# Patient Record
Sex: Male | Born: 1970 | Race: White | Hispanic: No | Marital: Married | Smoking: Former smoker
Health system: Southern US, Community
[De-identification: ages and names within clinical notes are randomized; demographics above are authoritative.]

---

## 2007-02-07 ENCOUNTER — Ambulatory Visit: Payer: Self-pay | Admitting: Internal Medicine

## 2007-02-07 ENCOUNTER — Emergency Department: Payer: Self-pay | Admitting: Emergency Medicine

## 2009-04-06 IMAGING — US ABDOMEN ULTRASOUND
1 series · 17 of 25 positions shown · non-contrast
Comparison: none

REASON FOR EXAM: RUQ, epigastric pain
COMMENTS:

[Series 1: abdomen ultrasound · 17 of 66 slices shown]
[im 1/66]
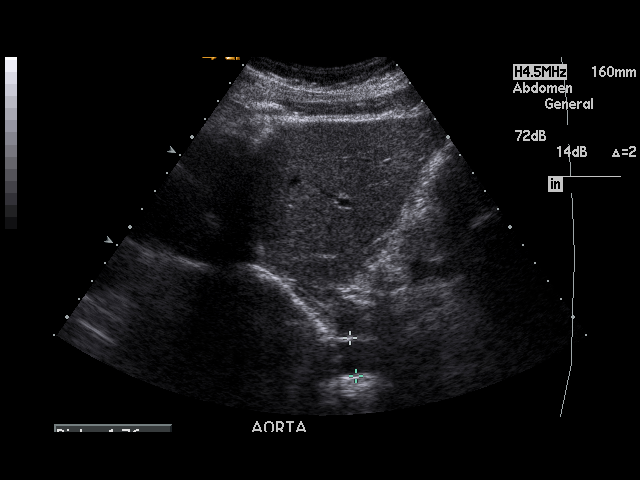
[im 6/66]
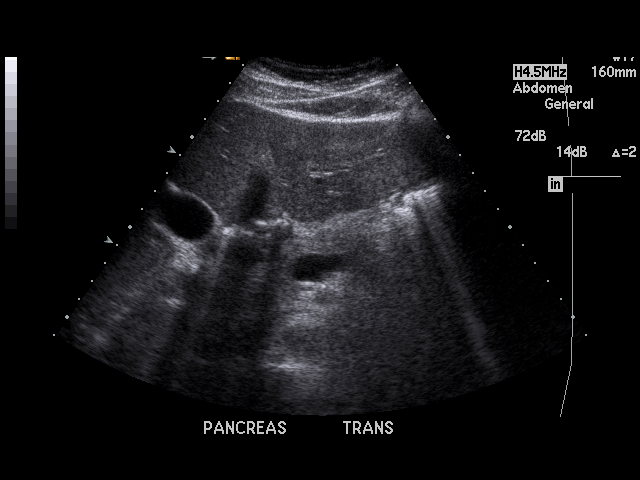
[im 9/66]
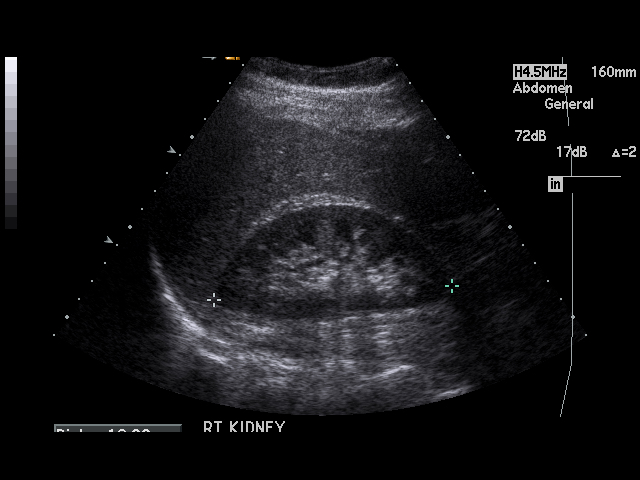
[im 14/66]
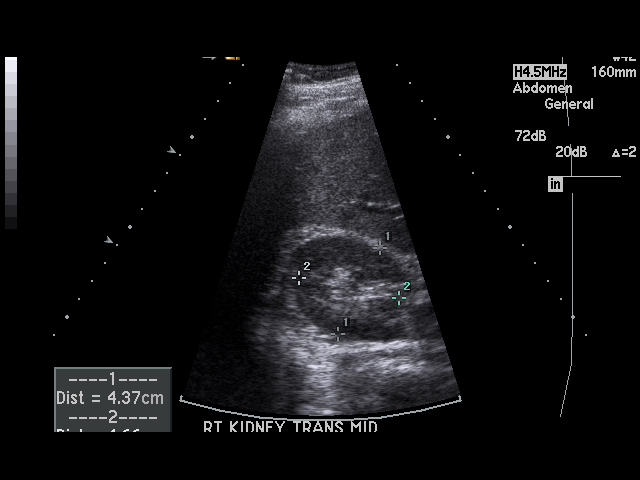
[im 17/66]
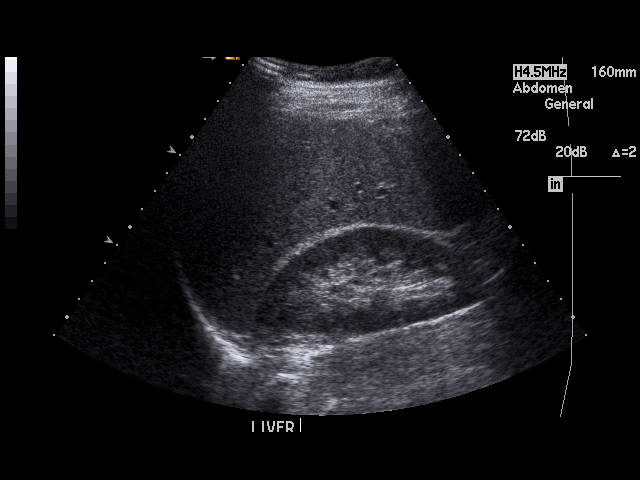
[im 22/66]
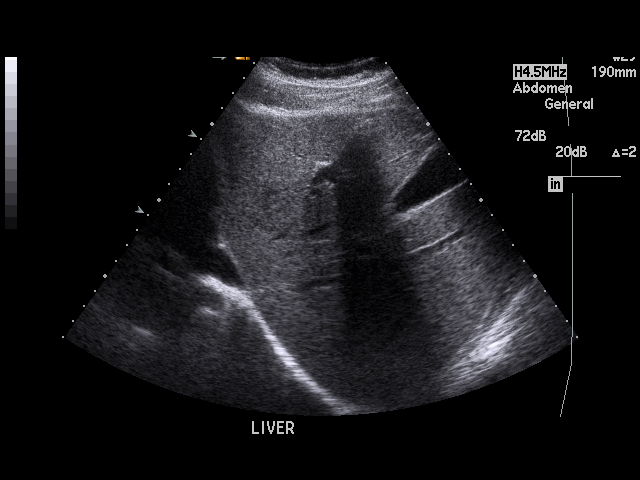
[im 25/66]
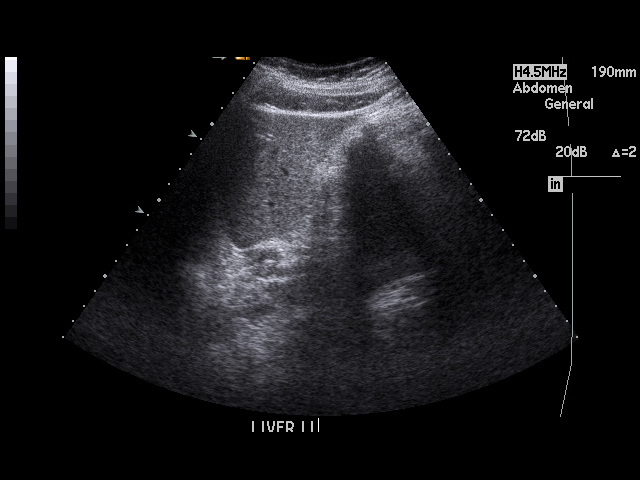
[im 30/66]
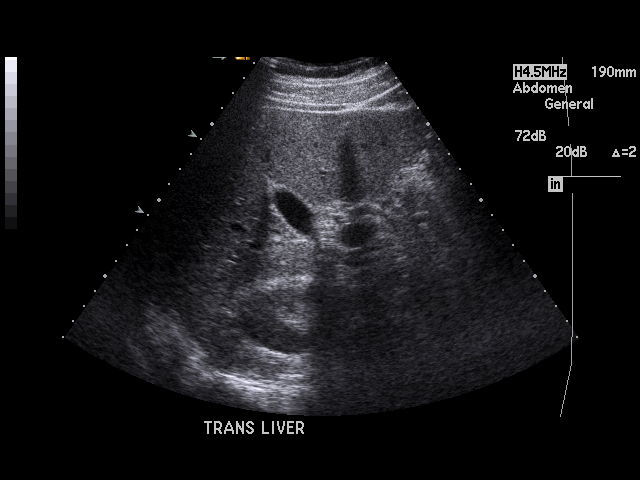
[im 33/66]
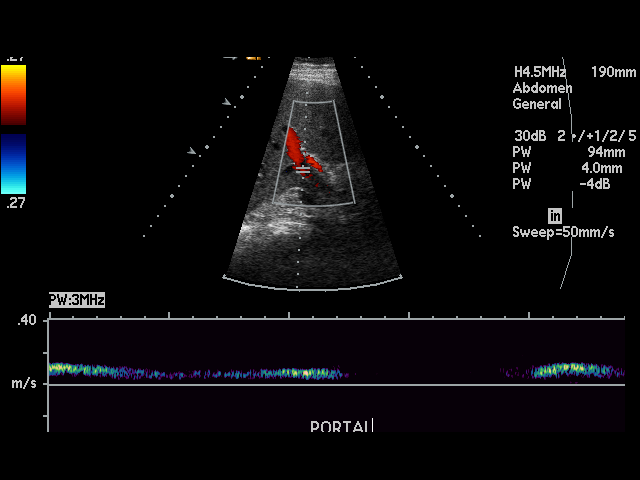
[im 36/66]
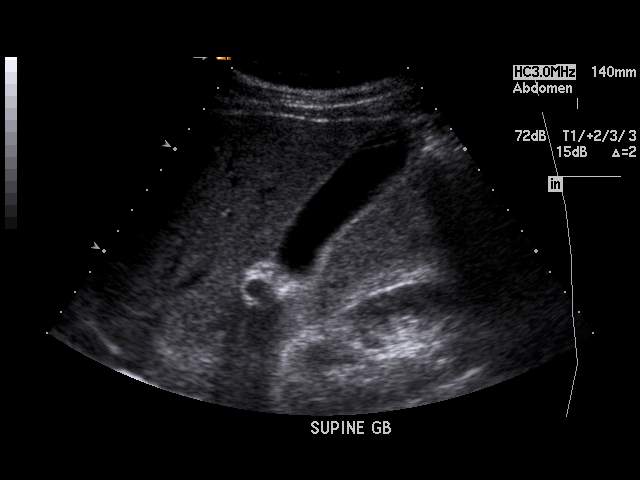
[im 41/66]
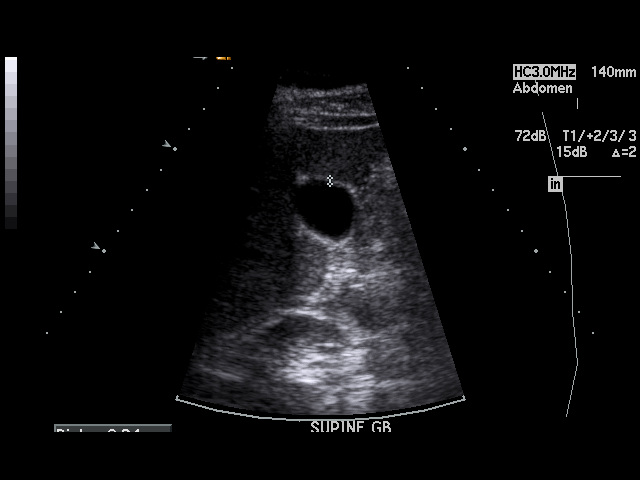
[im 44/66]
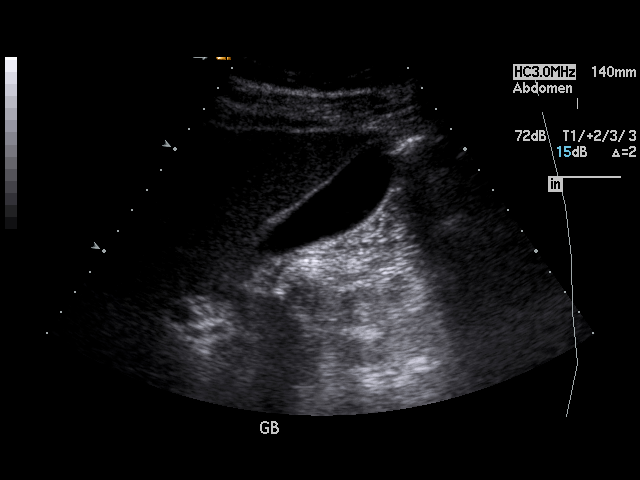
[im 49/66]
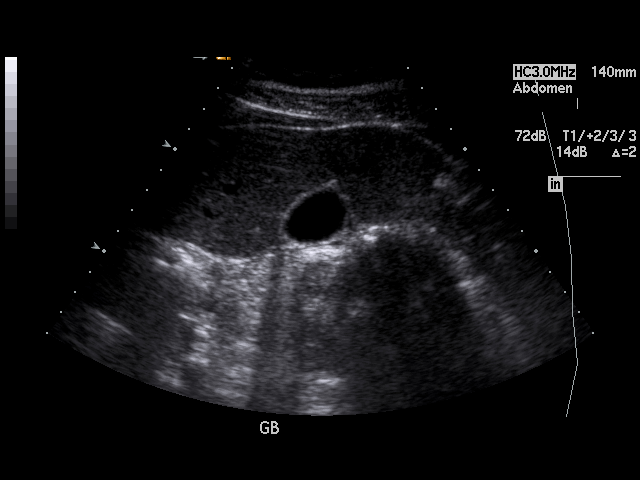
[im 52/66]
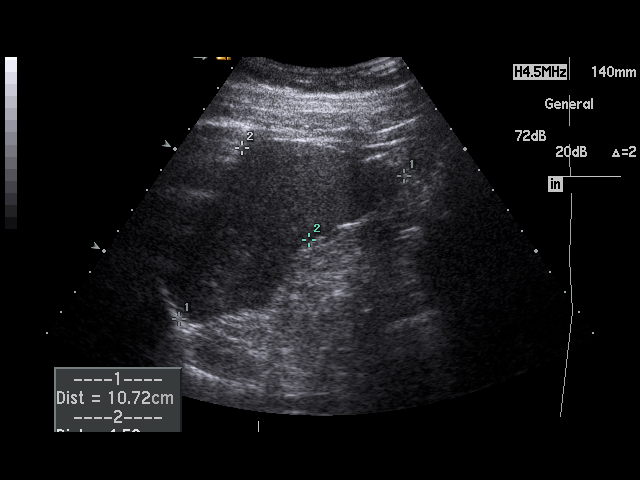
[im 57/66]
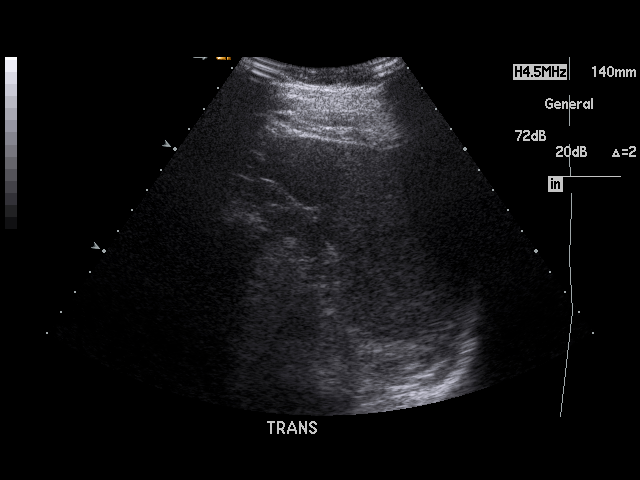
[im 60/66]
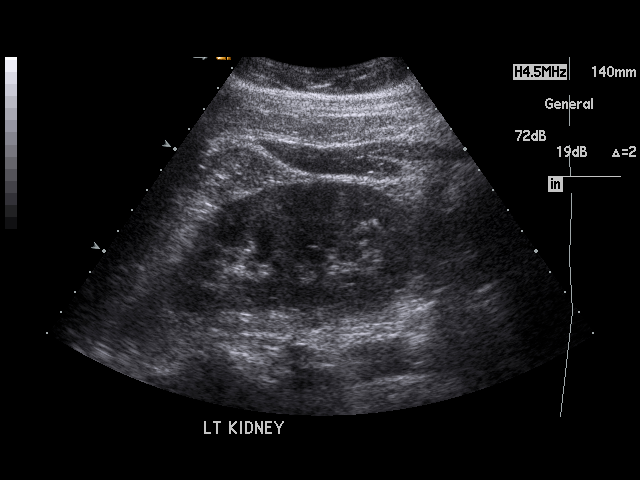
[im 66/66]
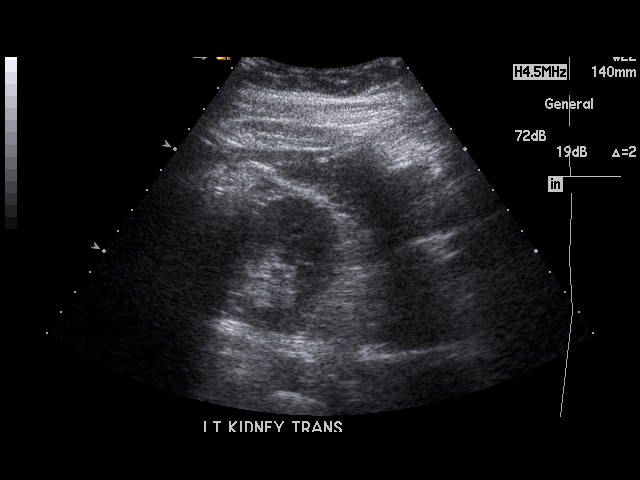

[17 of 25 positions shown; findings below may reference images not displayed]

PROCEDURE:     US  - US ABDOMEN GENERAL SURVEY  - February 07, 2007  [DATE]

RESULT:     Sonographic evaluation of the abdomen is performed emergently.
The gallbladder is normal. The common bile duct diameter is 4.3 mm. The
pancreas is not visualized because of overlying bowel gas. The kidneys,
spleen, liver and portal venous flow appear to be normal. The aorta was
partially obscured but showed no definite aneurysm or other significant
abnormality.
IMPRESSION: 1. Nonvisualization of the pancreas. Partial visualization of the aorta.
2. Otherwise, unremarkable abdominal sonogram.

## 2013-07-18 ENCOUNTER — Ambulatory Visit: Payer: Self-pay

## 2013-07-18 LAB — RAPID INFLUENZA A&B ANTIGENS

## 2015-02-27 ENCOUNTER — Ambulatory Visit
Admission: EM | Admit: 2015-02-27 | Discharge: 2015-02-27 | Discharge status: Absent without leave | Payer: BLUE CROSS/BLUE SHIELD

## 2015-09-15 IMAGING — CR DG CHEST 2V
1 series · 4 of 4 positions shown · non-contrast
Comparison: None.

CLINICAL DATA: Weakness, low-grade fever, dry cough for 3 months

EXAM:
CHEST  2 VIEW

[Series 1: pa · 0.17mm/px · 4 of 4 slices shown]
[im 1/4]
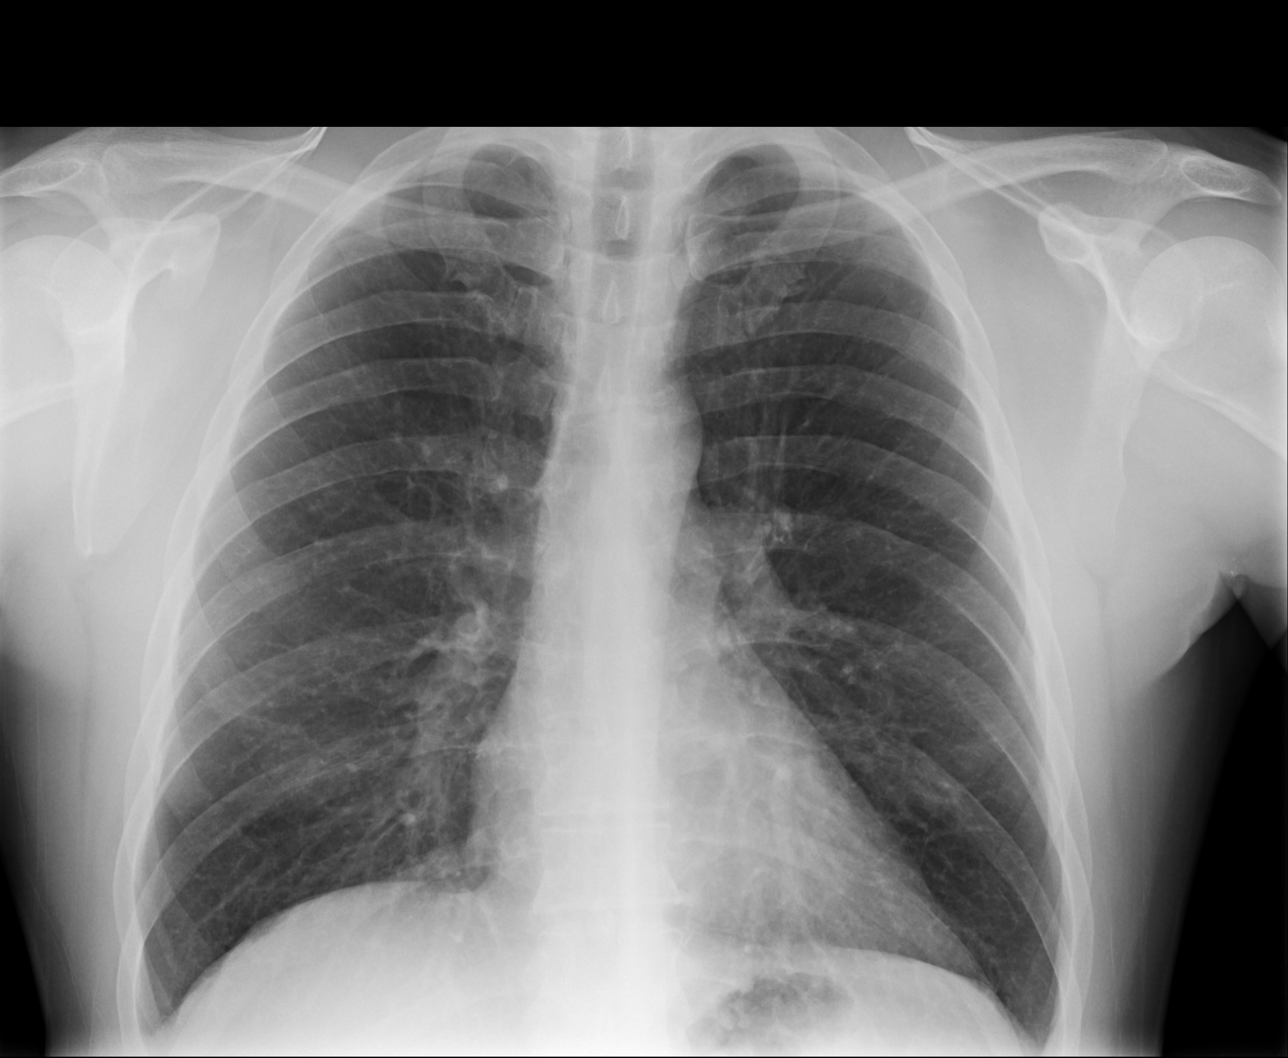
[im 2/4]
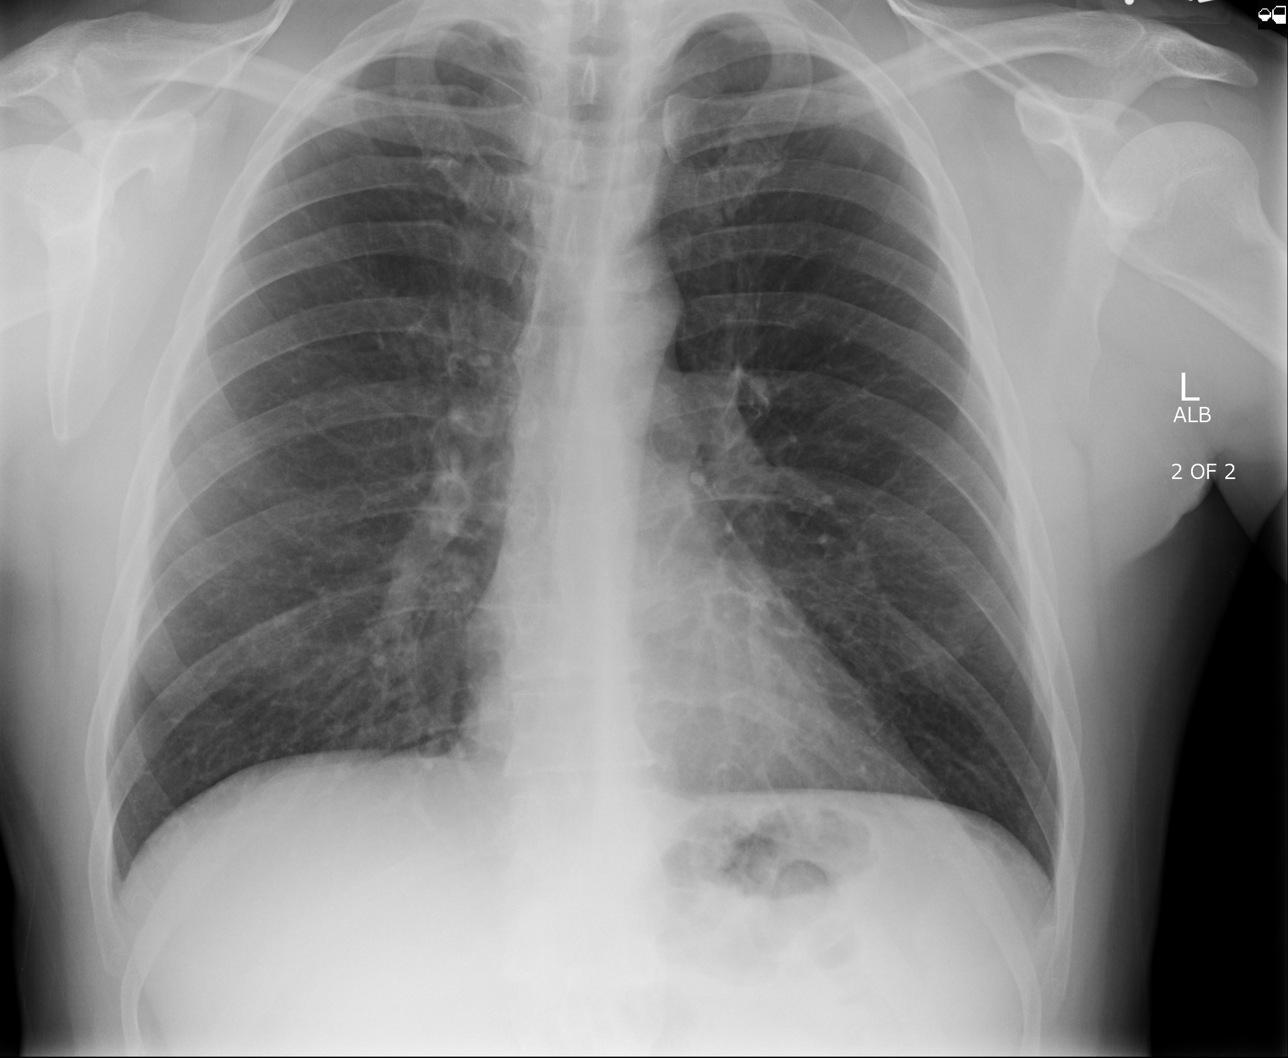
[im 3/4]
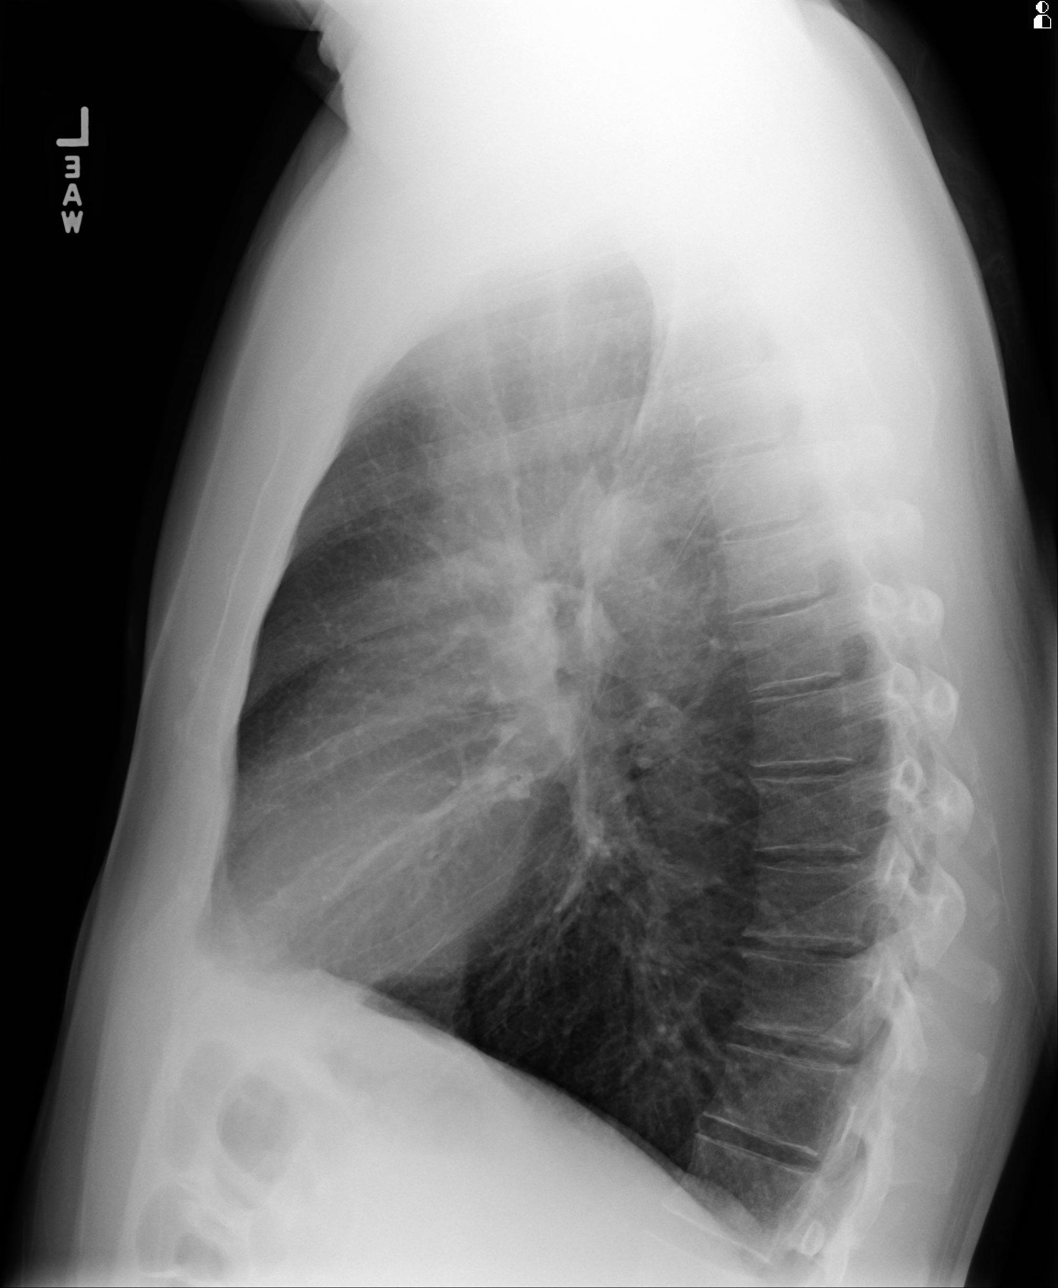
[im 4/4]
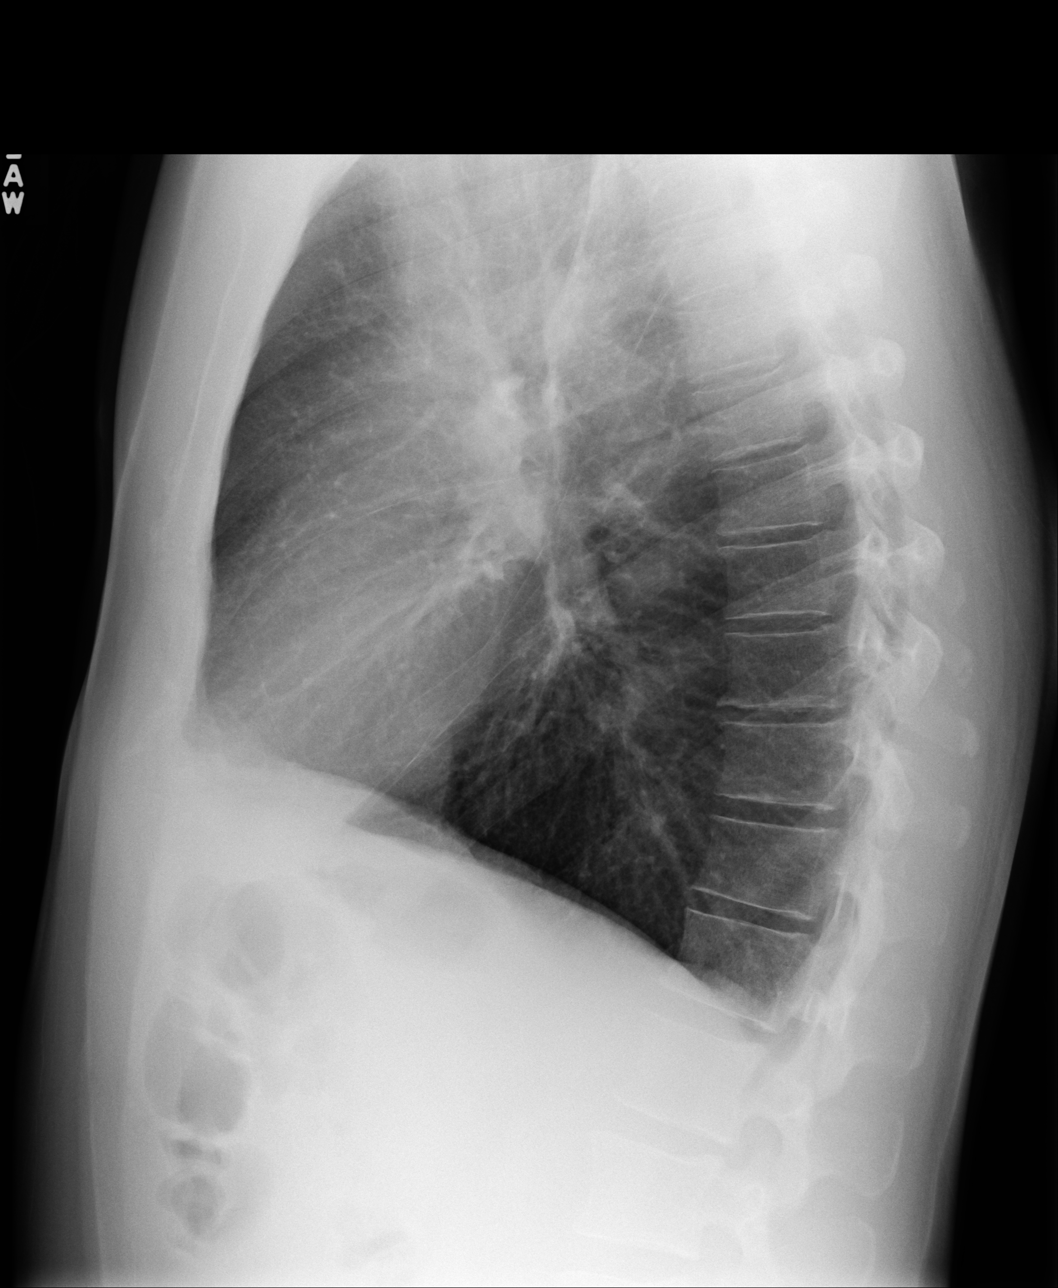

[4 of 4 positions shown; findings below may reference images not displayed]

FINDINGS: Cardiomediastinal silhouette is unremarkable. No acute infiltrate or
pleural effusion. No pulmonary edema. Minimal degenerative changes
thoracic spine. Mild hyperinflation.
IMPRESSION: No active disease.  Mild hyperinflation.

## 2018-07-31 ENCOUNTER — Ambulatory Visit (INDEPENDENT_AMBULATORY_CARE_PROVIDER_SITE_OTHER): Payer: Self-pay | Admitting: Family Medicine

## 2018-07-31 ENCOUNTER — Encounter: Payer: Self-pay | Admitting: Family Medicine

## 2018-07-31 ENCOUNTER — Other Ambulatory Visit: Payer: Self-pay

## 2018-07-31 DIAGNOSIS — K219 Gastro-esophageal reflux disease without esophagitis: Secondary | ICD-10-CM

## 2018-07-31 DIAGNOSIS — K279 Peptic ulcer, site unspecified, unspecified as acute or chronic, without hemorrhage or perforation: Secondary | ICD-10-CM

## 2018-07-31 DIAGNOSIS — Z7689 Persons encountering health services in other specified circumstances: Secondary | ICD-10-CM

## 2018-07-31 DIAGNOSIS — R1013 Epigastric pain: Secondary | ICD-10-CM

## 2018-07-31 MED ORDER — OMEPRAZOLE 40 MG PO CPDR: 40.0000 mg | DELAYED_RELEASE_CAPSULE | Freq: Every day | ORAL | 2 refills | Status: DC

## 2018-07-31 MED ORDER — SUCRALFATE 1 G PO TABS: 1.0000 g | ORAL_TABLET | Freq: Three times a day (TID) | ORAL | 1 refills | Status: DC

## 2018-07-31 NOTE — Patient Instructions (Addendum)
Thank you for coming to the office today.  I am concerned that one significant possibility could be uncontrolled Acid Reflux (GERD) and may have developed an Ulcer (Peptic Ulcer of stomach).  After you have completed your H Pylori Breath Test today, we can start with the prescribed medicines, and we will notify you of your result and if we have to change treatment.  Today you willcomplete the H Pylori Breath Test, FASTING for 4 hours before the test, NO FOOD OR DRINK, only small amount of water is fine, can take other medicines).  IF BREATH TEST = NEGATIVE (or waiting for results): Starting TODAY before LUNCH take Omeprazole 40mg  daily. Prefer to take this med about 30 min before breakfast or 1st meal of day for 4 weeks, don't stop taking unless we discuss first. Probably will need for about 8 weeks total if this ends up being an Ulcer. - Next day can switch over to [facility] - take 15-30 min before BREAKFAST every day going forward for the 8 weeks  Take other prescribed medicine Carafate (Sucralfate) as needed up to 4 times daily (3 meals and bedtime)  to coat stomach lining to ease symptoms, if it helps reduce symptoms then it is more likely to be due to acid and/or ulcer.  IF BREATH TEST = POSITIVE (we notified you of this result): - CHANGE Protonix dose from 40mg  daily to 40mg  (one pill) TWICE daily, about 30 min before breakfast and dinner - FOR TWO WEEKS, then resume DAILY treatment - Also we will send in TWO antibiotics to take IN ADDITION: - Amoxicillin 1g TWICE daily AND Clarithromycin 500mg  TWICE daily - both for 2 weeks  DIET RECOMMENDATIONS - Avoid spicy, greasy, fried foods, also things like caffeine, dark chocolate, peppermint can worsen - Avoid large meals and late night snacks, also do not go more than 4-5 hours without a snack or meal (not eating will worsen reflux symptoms due to stomach acid) - You may also elevate the head of your bed at night to sleep at very slight  incline to help reduce symptoms  If the problem improves but keeps coming back, we can discuss higher dose or longer course at next visit.  If symptoms are worsening or persistent despite treatment or develop any different severe esophagus or abdominal pain, unable to swallow solids or liquids, nausea, vomiting especially blood in vomit, fever/chills, or unintentional weight loss / no appetite, please follow-up sooner in office or seek more immediate medical attention at hospital Emergency Department.  Regarding other medicines:  - STOP taking Ibuprofen, Advil, Motrin, Goody's / BC powder - DO NOT take without discussing with your doctor. These medicines can put you at high risk for future bleeding.  If need pain medicine, may take Tylenol Extra Strength (Acetaminophen) 500mg  tabs - take 1 to 2 tabs per dose (max 1000mg ) every 6-8 hours for pain (take regularly, don't skip a dose for next 7 days), max 24 hour daily dose is 6 tablets or 3000mg . In the future you can repeat the same everyday Tylenol course for 1-2 weeks at a time.    DUE for FASTING BLOOD WORK (no food or drink after midnight before the lab appointment, only water or coffee without cream/sugar on the morning of)  SCHEDULE "Lab Only" visit in the morning at the clinic for lab draw in 3 months  - Make sure Lab Only appointment is at about 1 week before your next appointment, so that results will be available  For  Lab Results, once available within 2-3 days of blood draw, you can can log in to MyChart online to view your results and a brief explanation. Also, we can discuss results at next follow-up visit.   Please schedule a Follow-up Appointment to: Return in about 3 months (around 10/30/2018) for Annual Physical.  If you have any other questions or concerns, please feel free to call the office or send a message through MyChart. You may also schedule an earlier appointment if necessary.  Additionally, you may be receiving a  survey about your experience at our office within a few days to 1 week by e-mail or mail. We value your feedback.  Saralyn Pilar, DO [facility], [place]

## 2018-07-31 NOTE — Progress Notes (Addendum)
Subjective:    Patient ID: David Jimenez, male    DOB: [birth year], 48 y.o.   MRN: [id number]  David Jimenez is a 48 y.o. male presenting on 07/31/2018 for Abdominal Pain (constant abdominal pain mostly on empty stomaching, belching. The pt concern he could have a ulcer.x 3-4 yrs. He state he's tired to treat it with OTC acid medicine. history of long term Ibuprofen use, also more stress going on the last few weeks. )  Previous Dr Juanetta Gosling patient last seen >4 years ago, no records in our current EHR. He is considered a new patient to the office, and provider at this time.  Virtual Video Visit via WebEx The purpose of this virtual visit is to provide medical care while limiting exposure to the novel coronavirus (COVID19) for both patient and office staff.  Consent was obtained for phone visit:  Yes.   Answered questions that patient had about telehealth interaction:  Yes.   I discussed the limitations, risks, security and privacy concerns of performing an evaluation and management service by video/telephone. I also discussed with the patient that there may be a patient responsible charge related to this service. The patient expressed understanding and agreed to proceed.  Patient Location: Home Provider Location: [facility] (Office)  HPI   Epigastric Abdominal Pain / GERD / Presumed Peptic Ulcer Disease Chronic history of GERD and issues, years ago >10+ years ago with taking ibupofen frequently and also drinking sodas, former smoker, then he quit smoking 10 years ago. He thinks quit smoking did not help his health immediately. Now he describes persistent problem over several years with GERD type symptoms having abdominal episodic pains and concerns for ulcer in the past. However he usually does not have significant acid reflux heartburn traditional symptoms In past he was treated with Prilosec OTC  daily for up to 2 weeks at a time, often self treatment, never on rx  medication or longer courses. He takes OTC Gabascon- liquid, similar to a maalox PRN with some relief.  Today he presents to establish care now with concern of recent worsening over past days to weeks with epigastric abdominal pain, seems to be fairly predictable and episodic usually prior to meals on empty stomach and improved with meal, then can have worsening in evening at times. Describes sharp deeper pain radiation at times, moderate severity. He still takes antacid PRN as mentioned above. He has not taken any Omeprazole or similar medicine OTC for past >2 weeks now. Last meal was cereal for breakfast around 0700 today. He is not currently in significant pain at this time. - Also other factors, he has done some intermittent fasting in the past with >6-8 hours without meal or more at times, he thinks this may affect him as well causing worsening symptoms - He is no longer on ibuprofen regularly, he has reduced caffeine and other possible causes - He does not follow any particular stomach acid diet at this time - No prior GI evaluation or EGD or H Pylori testing before  Admits abdominal bloating and excessive gas, episodic pain Denies any dark stool, blood in stool, nausea vomiting, fever chills, diarrhea, constipation   Depression screen [facility] 2/9 07/31/2018  Decreased Interest 0  Down, Depressed, Hopeless 0  PHQ - 2 Score 0    History reviewed. No pertinent past medical history. History reviewed. No pertinent surgical history. Social History   Socioeconomic History  . Marital status: Married    Spouse name: Not  on file  . Number of children: Not on file  . Years of education: Not on file  . Highest education level: Not on file  Occupational History  . Not on file  Social Needs  . Financial resource strain: Not on file  . Food insecurity:    Worry: Not on file    Inability: Not on file  . Transportation needs:    Medical: Not on file    Non-medical: Not on file  Tobacco Use  .  Smoking status: Former Smoker    Last attempt to quit: 07/30/2004    Years since quitting: 14.0  . Smokeless tobacco: Never Used  Substance and Sexual Activity  . Alcohol use: Yes    Frequency: Never    Comment: occassional  . Drug use: Never  . Sexual activity: Not on file  Lifestyle  . Physical activity:    Days per week: Not on file    Minutes per session: Not on file  . Stress: Not on file  Relationships  . Social connections:    Talks on phone: Not on file    Gets together: Not on file    Attends religious service: Not on file    Active member of club or organization: Not on file    Attends meetings of clubs or organizations: Not on file    Relationship status: Not on file  . Intimate partner violence:    Fear of current or ex partner: Not on file    Emotionally abused: Not on file    Physically abused: Not on file    Forced sexual activity: Not on file  Other Topics Concern  . Not on file  Social History Narrative  . Not on file   History reviewed. No pertinent family history. No current outpatient medications on file prior to visit.   No current facility-administered medications on file prior to visit.     Review of Systems Per HPI unless specifically indicated above     Objective:    There were no vitals taken for this visit.  Wt Readings from Last 3 Encounters:  No data found for Wt    No vital signs obtained virtually.  Physical Exam   Note examination was completely remotely via video observational objective data only  Gen - well-appearing, no acute distress or apparent pain, comfortable HEENT - eyes appear clear without scleral icterus or discharge Heart/Lungs -Not examined. No evidence of coughing or labored breathing. Abd - localized area of pain as demonstrated by patient is epigastric region, cannot exam virtually Skin - face visible today- no rashes Neuro - awake, alert, oriented Psych - not anxious appearing  No results found for this or  any previous visit.    Assessment & Plan:   Problem List Items Addressed This Visit    PUD (peptic ulcer disease) - Primary   Relevant Medications   omeprazole (PRILOSEC) 40 MG capsule   sucralfate (CARAFATE) 1 g tablet   Other Relevant Orders   CBC with Differential/Platelet   COMPLETE METABOLIC PANEL WITH GFR   H. pylori breath test    Other Visit Diagnoses    Encounter to establish care with new doctor       Gastroesophageal reflux disease, esophagitis presence not specified       Relevant Medications   omeprazole (PRILOSEC) 40 MG capsule   sucralfate (CARAFATE) 1 g tablet   Epigastric abdominal pain       Relevant Orders   CBC with Differential/Platelet  COMPLETE METABOLIC PANEL WITH GFR      Suspected PUD, acute on chronic problem now for years, secondary to multiple causes such as dietary, stress, history of NSAID, likely some chronic gastritis underlying vs GERD - Failed temporary PPI Omeprazole 20 No other significant GI red flags (no unintentional wt loss, night-sweats, refractory abdominal pain n/v, hematemesis or melena).  Plan: 1. COME IN TO OFFICE BY 11AM today - (4 hours after last meal) fasting - today to check H pylori breath test (given in office), confirmed off PPI >2 weeks now - if positive, will increase PPI to BID dosing, add on triple therapy antibiotics with amoxicillin (1g BID) and clarithromycin (500mg  BID) for 2 weeks  Also check CMET, CBC for additional abdominal pain work-up  2. Start prolonged PPI trial with Omeprazole 40mg  daily for up to 8 weeks total given concern for ulcer - can take 1st dose before lunch then needs to take daily AM before breakfast 3. Start carafate 1g TID WC + QHS PRN for symptom relief 4. Strict return criteria given for worsening symptoms 5. Follow-up within 1-2 weeks, if no improvement, will refer to GI for further evaluation, may need future EGD given chronicity of problem. If gradual improvement can f/u in office within 4  weeks   Meds ordered this encounter  Medications  . omeprazole (PRILOSEC) 40 MG capsule    Sig: Take 1 capsule (40 mg total) by mouth daily before breakfast.    Dispense:  30 capsule    Refill:  2  . sucralfate (CARAFATE) 1 g tablet    Sig: Take 1 tablet (1 g total) by mouth 4 (four) times daily -  with meals and at bedtime. As needed only.    Dispense:  60 tablet    Refill:  1    Follow-up: - Return in 6 weeks for telephone visit virtual update / and then 3 months from now for annual physical - Future labs to be ordered in morning  Patient verbalizes understanding with the above medical recommendations including the limitation of remote medical advice.  Specific follow-up and call-back criteria were given for patient to follow-up or seek medical care more urgently if needed.  Future labs TBD at upcoming Annual Physical  Duration of video conference: 32 minutes  Saralyn Pilar, DO [facility] 07/31/2018, 8:59 AM

## 2018-08-01 LAB — COMPLETE METABOLIC PANEL WITH GFR
AG Ratio: 1.7 (calc) (ref 1.0–2.5)
ALT: 23 U/L (ref 9–46)
AST: 16 U/L (ref 10–40)
Albumin: 4.3 g/dL (ref 3.6–5.1)
Alkaline phosphatase (APISO): 80 U/L (ref 36–130)
BUN: 11 mg/dL (ref 7–25)
CALCIUM: 9.5 mg/dL (ref 8.6–10.3)
CO2: 27 mmol/L (ref 20–32)
CREATININE: 1.12 mg/dL (ref 0.60–1.35)
Chloride: 103 mmol/L (ref 98–110)
GFR, Est African American: 90 mL/min/{1.73_m2} (ref >=60)
GFR, Est Non African American: 77 mL/min/{1.73_m2} (ref >=60)
GLUCOSE: 90 mg/dL (ref 65–99)
Globulin: 2.5 g/dL (calc) (ref 1.9–3.7)
Potassium: 4.3 mmol/L (ref 3.5–5.3)
Sodium: 140 mmol/L (ref 135–146)
Total Bilirubin: 0.4 mg/dL (ref 0.2–1.2)
Total Protein: 6.8 g/dL (ref 6.1–8.1)

## 2018-08-01 LAB — CBC WITH DIFFERENTIAL/PLATELET
Absolute Monocytes: 1118 cells/uL — ABNORMAL HIGH (ref 200–950)
Basophils Absolute: 124 cells/uL (ref 0–200)
Basophils Relative: 0.9 %
Eosinophils Absolute: 649 cells/uL — ABNORMAL HIGH (ref 15–500)
Eosinophils Relative: 4.7 %
HEMATOCRIT: 48.3 % (ref 38.5–50.0)
Hemoglobin: 16.3 g/dL (ref 13.2–17.1)
LYMPHS ABS: 3160 {cells}/uL (ref 850–3900)
MCH: 28.7 pg (ref 27.0–33.0)
MCHC: 33.7 g/dL (ref 32.0–36.0)
MCV: 85 fL (ref 80.0–100.0)
MPV: 10.1 fL (ref 7.5–12.5)
Monocytes Relative: 8.1 %
NEUTROS PCT: 63.4 %
Neutro Abs: 8749 cells/uL — ABNORMAL HIGH (ref 1500–7800)
Platelets: 391 10*3/uL (ref 140–400)
RBC: 5.68 10*6/uL (ref 4.20–5.80)
RDW: 13.2 % (ref 11.0–15.0)
Total Lymphocyte: 22.9 %
WBC: 13.8 10*3/uL — ABNORMAL HIGH (ref 3.8–10.8)

## 2018-08-01 LAB — H. PYLORI BREATH TEST: H. PYLORI BREATH TEST: NOT DETECTED

## 2018-10-17 ENCOUNTER — Other Ambulatory Visit: Payer: Self-pay

## 2018-10-24 ENCOUNTER — Telehealth: Payer: Self-pay

## 2018-10-24 ENCOUNTER — Other Ambulatory Visit: Payer: BLUE CROSS/BLUE SHIELD

## 2018-10-24 DIAGNOSIS — Z0000 Encounter for general adult medical examination without abnormal findings: Secondary | ICD-10-CM

## 2018-10-24 NOTE — Telephone Encounter (Signed)
Ordered Lipid A1c CBC, for annual labs.  David Jimenez, [facility] 10/24/2018, 9:10 AM

## 2018-10-25 LAB — CBC WITH DIFFERENTIAL/PLATELET
Absolute Monocytes: 937 cells/uL (ref 200–950)
Basophils Absolute: 98 cells/uL (ref 0–200)
Basophils Relative: 0.9 %
Eosinophils Absolute: 1057 cells/uL — ABNORMAL HIGH (ref 15–500)
Eosinophils Relative: 9.7 %
HCT: 49.8 % (ref 38.5–50.0)
Hemoglobin: 16.3 g/dL (ref 13.2–17.1)
Lymphs Abs: 2627 cells/uL (ref 850–3900)
MCH: 28.2 pg (ref 27.0–33.0)
MCHC: 32.7 g/dL (ref 32.0–36.0)
MCV: 86.3 fL (ref 80.0–100.0)
MPV: 9.8 fL (ref 7.5–12.5)
Monocytes Relative: 8.6 %
Neutro Abs: 6180 cells/uL (ref 1500–7800)
Neutrophils Relative %: 56.7 %
Platelets: 374 10*3/uL (ref 140–400)
RBC: 5.77 10*6/uL (ref 4.20–5.80)
RDW: 13.5 % (ref 11.0–15.0)
Total Lymphocyte: 24.1 %
WBC: 10.9 10*3/uL — ABNORMAL HIGH (ref 3.8–10.8)

## 2018-10-25 LAB — LIPID PANEL
Cholesterol: 212 mg/dL — ABNORMAL HIGH (ref <200)
HDL: 36 mg/dL — ABNORMAL LOW (ref >=40)
LDL Cholesterol (Calc): 146 mg/dL (calc) — ABNORMAL HIGH
Non-HDL Cholesterol (Calc): 176 mg/dL (calc) — ABNORMAL HIGH (ref <130)
Total CHOL/HDL Ratio: 5.9 (calc) — ABNORMAL HIGH (ref <5.0)
Triglycerides: 166 mg/dL — ABNORMAL HIGH (ref <150)

## 2018-10-25 LAB — HEMOGLOBIN A1C
Hgb A1c MFr Bld: 5.4 % of total Hgb (ref <5.7)
Mean Plasma Glucose: 108 (calc)
eAG (mmol/L): 6 (calc)

## 2018-10-30 ENCOUNTER — Ambulatory Visit (INDEPENDENT_AMBULATORY_CARE_PROVIDER_SITE_OTHER): Payer: BLUE CROSS/BLUE SHIELD | Admitting: Family Medicine

## 2018-10-30 ENCOUNTER — Other Ambulatory Visit: Payer: Self-pay | Admitting: Family Medicine

## 2018-10-30 ENCOUNTER — Other Ambulatory Visit: Payer: Self-pay

## 2018-10-30 ENCOUNTER — Encounter: Payer: Self-pay | Admitting: Family Medicine

## 2018-10-30 VITALS — BP 127/71 | HR 90 | Temp 98.2°F | Resp 16 | Ht 72.0 in | Wt 200.8 lb

## 2018-10-30 DIAGNOSIS — K219 Gastro-esophageal reflux disease without esophagitis: Secondary | ICD-10-CM | POA: Diagnosis not present

## 2018-10-30 DIAGNOSIS — E663 Overweight: Secondary | ICD-10-CM

## 2018-10-30 DIAGNOSIS — Z0000 Encounter for general adult medical examination without abnormal findings: Secondary | ICD-10-CM

## 2018-10-30 MED ORDER — OMEPRAZOLE 20 MG PO CPDR: 20.0000 mg | DELAYED_RELEASE_CAPSULE | Freq: Every day | ORAL | 1 refills | Status: DC

## 2018-10-30 NOTE — Progress Notes (Signed)
Subjective:    Patient ID: David DeerJeremy S Palermo, male    DOB: 06/03/1970, 48 y.o.   MRN: [id number]  David Jimenez is a 48 y.o. male presenting on 10/30/2018 for Annual Exam   HPI   Here for Annual Physical and Lab Review.  Lifestyle /Overweight BMI >27 Diet - improved diet, see below, less soda, alcohol, balanced Exercise - active at work  HYPERLIPIDEMIA: - Reports no concerns. Last lipid panel 10/2018, mild elevated LDL 140s and low HDL 30s - In past he tried Statin >5 years ago for similar cholesterol result, he had side effect reaction myalgias intolerance, not interested in resuming.  Osteoarthritis / Joint Aching Reports daily activity can cause some joint ache and stiffness, hands and other joints, he feels like "inflammation". Has tried ibuprofen with relief, otherwise this caused his stomach acid worsening he tries not to take it, tylenol did not help.  GERD vs PUD - Last visit with me, for initial visit 07/2018 same problem GERD, treated with H pylori test negative and labs, treated for GERD/PUD with PPI after 2 weeks significant improve, see prior notes for background information. - Interval update with he has reduced sodas, limited alcohol now, very rare only 3 beers in 3 months - Today patient reports he is doing very well overall, symptoms nearly 100% gone, reduced belching and gas as well. He has changed diet as mentioned above, and also improved morning meal to smoothie with good results - Took Omeprazole 40mg  daily for 3 months, then today is first day off medicine - Took Sucralfate PRN has some left over - Denies any rebound symptoms or abdominal pain    Health Maintenance: Due for Tdap, can get booster in future, due q 10 yr  Consider cologuard, at age 48, declines now.  Not due for early prostate cancer screening, next age 48  Depression screen PHQ 2/9 10/30/2018 07/31/2018  Decreased Interest 0 0  Down, Depressed, Hopeless 0 0  PHQ - 2 Score 0 0     History reviewed. No pertinent past medical history. History reviewed. No pertinent surgical history. Social History   Socioeconomic History   Marital status: Married    Spouse name: Not on file   Number of children: Not on file   Years of education: Not on file   Highest education level: Not on file  Occupational History   Not on file  Social Needs   Financial resource strain: Not on file   Food insecurity    Worry: Not on file    Inability: Not on file   Transportation needs    Medical: Not on file    Non-medical: Not on file  Tobacco Use   Smoking status: Former Smoker    Quit date: 07/30/2004    Years since quitting: 14.2   Smokeless tobacco: Former Engineer, waterUser  Substance and Sexual Activity   Alcohol use: Yes    Frequency: Never    Comment: occassional   Drug use: Never   Sexual activity: Not on file  Lifestyle   Physical activity    Days per week: Not on file    Minutes per session: Not on file   Stress: Not on file  Relationships   Social connections    Talks on phone: Not on file    Gets together: Not on file    Attends religious service: Not on file    Active member of club or organization: Not on file    Attends meetings of clubs  or organizations: Not on file    Relationship status: Not on file   Intimate partner violence    Fear of current or ex partner: Not on file    Emotionally abused: Not on file    Physically abused: Not on file    Forced sexual activity: Not on file  Other Topics Concern   Not on file  Social History Narrative   Not on file   Family History  Problem Relation Age of Onset   Congestive Heart Failure Father    Congestive Heart Failure Paternal Uncle    Current Outpatient Medications on File Prior to Visit  Medication Sig   sucralfate (CARAFATE) 1 g tablet Take 1 tablet (1 g total) by mouth 4 (four) times daily -  with meals and at bedtime. As needed only.   No current facility-administered medications on  file prior to visit.     Review of Systems  Constitutional: Negative for activity change, appetite change, chills, diaphoresis, fatigue and fever.  HENT: Negative for congestion and hearing loss.   Eyes: Negative for visual disturbance.  Respiratory: Negative for apnea, cough, chest tightness, shortness of breath and wheezing.   Cardiovascular: Negative for chest pain, palpitations and leg swelling.  Gastrointestinal: Negative for abdominal pain, anal bleeding, blood in stool, constipation, diarrhea, nausea and vomiting.  Genitourinary: Negative for difficulty urinating, dysuria, frequency and hematuria.  Musculoskeletal: Negative for arthralgias, back pain and neck pain.  Skin: Negative for rash.  Allergic/Immunologic: Negative for environmental allergies.  Neurological: Negative for dizziness, weakness, light-headedness, numbness and headaches.  Hematological: Negative for adenopathy.  Psychiatric/Behavioral: Negative for behavioral problems, dysphoric mood and sleep disturbance. The patient is not nervous/anxious.    Per HPI unless specifically indicated above      Objective:    BP 127/71    Pulse 90    Temp 98.2 F (36.8 C) (Oral)    Resp 16    Ht 6' (1.829 m)    Wt 200 lb 12.8 oz (91.1 kg)    BMI 27.23 kg/m   Wt Readings from Last 3 Encounters:  10/30/18 200 lb 12.8 oz (91.1 kg)    Physical Exam Vitals signs and nursing note reviewed.  Constitutional:      General: He is not in acute distress.    Appearance: He is well-developed. He is not diaphoretic.     Comments: Well-appearing, comfortable, cooperative  HENT:     Head: Normocephalic and atraumatic.  Eyes:     General:        Right eye: No discharge.        Left eye: No discharge.     Conjunctiva/sclera: Conjunctivae normal.     Pupils: Pupils are equal, round, and reactive to light.  Neck:     Musculoskeletal: Normal range of motion and neck supple.     Thyroid: No thyromegaly.  Cardiovascular:     Rate and  Rhythm: Normal rate and regular rhythm.     Heart sounds: Normal heart sounds. No murmur.  Pulmonary:     Effort: Pulmonary effort is normal. No respiratory distress.     Breath sounds: Normal breath sounds. No wheezing or rales.  Abdominal:     General: Bowel sounds are normal. There is no distension.     Palpations: Abdomen is soft. There is no mass.     Tenderness: There is no abdominal tenderness.  Musculoskeletal: Normal range of motion.        General: No tenderness.  Comments: Upper / Lower Extremities: - Normal muscle tone, strength bilateral upper extremities 5/5, lower extremities 5/5  Lymphadenopathy:     Cervical: No cervical adenopathy.  Skin:    General: Skin is warm and dry.     Findings: No erythema or rash.  Neurological:     Mental Status: He is alert and oriented to person, place, and time.     Comments: Distal sensation intact to light touch all extremities  Psychiatric:        Behavior: Behavior normal.     Comments: Well groomed, good eye contact, normal speech and thoughts      Results for orders placed or performed in visit on 10/24/18  Lipid panel  Result Value Ref Range   Cholesterol 212 (H) <200 mg/dL   HDL 36 (L) > OR = 40 mg/dL   Triglycerides 409166 (H) <150 mg/dL   LDL Cholesterol (Calc) 146 (H) mg/dL (calc)   Total CHOL/HDL Ratio 5.9 (H) <5.0 (calc)   Non-HDL Cholesterol (Calc) 176 (H) <130 mg/dL (calc)  CBC with Differential  Result Value Ref Range   WBC 10.9 (H) 3.8 - 10.8 Thousand/uL   RBC 5.77 4.20 - 5.80 Million/uL   Hemoglobin 16.3 13.2 - 17.1 g/dL   HCT 81.149.8 91.438.5 - 78.250.0 %   MCV 86.3 80.0 - 100.0 fL   MCH 28.2 27.0 - 33.0 pg   MCHC 32.7 32.0 - 36.0 g/dL   RDW 95.613.5 21.311.0 - 08.615.0 %   Platelets 374 140 - 400 Thousand/uL   MPV 9.8 7.5 - 12.5 fL   Neutro Abs 6,180 1,500 - 7,800 cells/uL   Lymphs Abs 2,627 850 - 3,900 cells/uL   Absolute Monocytes 937 200 - 950 cells/uL   Eosinophils Absolute 1,057 (H) 15 - 500 cells/uL   Basophils  Absolute 98 0 - 200 cells/uL   Neutrophils Relative % 56.7 %   Total Lymphocyte 24.1 %   Monocytes Relative 8.6 %   Eosinophils Relative 9.7 %   Basophils Relative 0.9 %  Hemoglobin A1c  Result Value Ref Range   Hgb A1c MFr Bld 5.4 <5.7 % of total Hgb   Mean Plasma Glucose 108 (calc)   eAG (mmol/L) 6.0 (calc)      Assessment & Plan:   Problem List Items Addressed This Visit    GERD (gastroesophageal reflux disease)    Nearly resolved on prolonged PPI course Improved lifestyle diet Negative H Pylori breath  Plan - Reduce dose PPI Omeprazole 40 daily down to 20mg  daily, rx sent, may take for 4 weeks then taper off gradually, caution rebound symptoms - May take Sucralfate PRN - May use PPI or sucralfate PRN with NSAID in future - Follow-up sooner if not improved      Relevant Medications   omeprazole (PRILOSEC) 20 MG capsule   Overweight (BMI 25.0-29.9)    Other Visit Diagnoses    Annual physical exam    -  Primary     Updated Health Maintenance information - Future screening, PSA at age 48, Cologuard at age 48, return for TDap if interested Reviewed recent lab results with patient - Discussed may warrant future cholesterol medicine, he is hesitant with statin myalgia in past, defer for now Encouraged improvement to lifestyle with diet and exercise - Goal of weight loss  #Joint stiffness/ ache inflammation General OTC advice given with AVS today Follow-up for this specific issue if needed further investigation  Meds ordered this encounter  Medications   omeprazole (PRILOSEC) 20 MG  capsule    Sig: Take 1 capsule (20 mg total) by mouth daily before breakfast.    Dispense:  90 capsule    Refill:  1     Follow up plan: Return in about 1 year (around 10/30/2019) for Annual Physical.  Future lab only then 1 year 10/2019  Saralyn PilarAlexander Yoshua Geisinger, DO [facility] 10/30/2018, 2:48 PM

## 2018-10-30 NOTE — Patient Instructions (Addendum)
Thank you for coming to the office today.  Re order Omeprazole 20mg  daliy - try to take this daily for few week then taper down to every other day then every 3rd day and eventually off - caution with rebound symptoms  Recommend to start taking Tylenol Extra Strength 500mg  tabs - take 1 to 2 tabs per dose (max 1000mg ) every 6-8 hours for pain (take regularly, don't skip a dose for next 7 days), max 24 hour daily dose is 6 tablets or 3000mg . In the future you can repeat the same everyday Tylenol course for 1-2 weeks at a time.   Try Aleve if you need it with meal 220 or 250mg  1-2 pills per dose and 1-2 times a day with meal for 1-2 week at a time or less, if you need it, it can be taken with Tylenol - If you have to take it, prefer to ALSO RESTART or take WITH Omeprazole to help reduce inflammation in stomach.  Can try Glucosamine supplement for joint health - help improve joint movement  Future cologuard age 16 for screening  Future reconsider cholesterol medicine.  Please schedule a Follow-up Appointment to: Return in about 1 year (around 10/30/2019) for Annual Physical.  If you have any other questions or concerns, please feel free to call the office or send a message through [place]. You may also schedule an earlier appointment if necessary.  Additionally, you may be receiving a survey about your experience at our office within a few days to 1 week by e-mail or mail. We value your feedback.  Nobie Putnam, DO [place]

## 2018-10-30 NOTE — Assessment & Plan Note (Signed)
Nearly resolved on prolonged PPI course Improved lifestyle diet Negative H Pylori breath  Plan - Reduce dose PPI Omeprazole 40 daily down to 20mg  daily, rx sent, may take for 4 weeks then taper off gradually, caution rebound symptoms - May take Sucralfate PRN - May use PPI or sucralfate PRN with NSAID in future - Follow-up sooner if not improved

## 2020-05-13 ENCOUNTER — Ambulatory Visit (INDEPENDENT_AMBULATORY_CARE_PROVIDER_SITE_OTHER): Payer: Self-pay | Admitting: Family Medicine

## 2020-05-13 ENCOUNTER — Other Ambulatory Visit: Payer: Self-pay

## 2020-05-13 ENCOUNTER — Encounter: Payer: Self-pay | Admitting: Family Medicine

## 2020-05-13 VITALS — BP 120/68 | HR 63 | Ht 72.0 in | Wt 208.0 lb

## 2020-05-13 DIAGNOSIS — K219 Gastro-esophageal reflux disease without esophagitis: Secondary | ICD-10-CM

## 2020-05-13 DIAGNOSIS — Z125 Encounter for screening for malignant neoplasm of prostate: Secondary | ICD-10-CM

## 2020-05-13 DIAGNOSIS — E559 Vitamin D deficiency, unspecified: Secondary | ICD-10-CM

## 2020-05-13 DIAGNOSIS — N529 Male erectile dysfunction, unspecified: Secondary | ICD-10-CM

## 2020-05-13 DIAGNOSIS — E663 Overweight: Secondary | ICD-10-CM

## 2020-05-13 MED ORDER — SILDENAFIL CITRATE 20 MG PO TABS: ORAL_TABLET | ORAL | 3 refills | Status: DC

## 2020-05-13 NOTE — Patient Instructions (Addendum)
Thank you for coming to the office today.  LEFT EAR Wax  Try Debrox drops OTC for ear wax, and mostly warm water with small peroxide to flush with a bulb syringe  Colon Cancer Screening: - For all adults age 51+ routine colon cancer screening is highly recommended.     - Recent guidelines from [place] recommend starting age of 7 - Early detection of colon cancer is important, because often there are no warning signs or symptoms, also if found early usually it can be cured. Late stage is hard to treat.  - If you are not interested in Colonoscopy screening (if done and normal you could be cleared for 5 to 10 years until next due), then Cologuard is an excellent alternative for screening test for Colon Cancer. It is highly sensitive for detecting DNA of colon cancer from even the earliest stages. Also, there is NO bowel prep required. - If Cologuard is NEGATIVE, then it is good for 3 years before next due - If Cologuard is POSITIVE, then it is strongly advised to get a Colonoscopy, which allows the GI doctor to locate the source of the cancer or polyp (even very early stage) and treat it by removing it. ------------------------- If you would like to proceed with Cologuard (stool DNA test) - FIRST, call your insurance company and tell them you want to check cost of Cologuard tell them CPT Code [phone] (it may be completely covered and you could get for no cost, OR max cost without any coverage is about $600). Also, keep in mind if you do NOT open the kit, and decide not to do the test, you will NOT be charged, you should contact the company if you decide not to do the test. - If you want to proceed, you can notify us (phone message, [place], or at next visit) and we will order it for you. The test kit will be delivered to you house within about 1 week. Follow instructions to collect sample, you may call the company for any help or questions, 24/7 telephone support at  [phone].   DUE for FASTING BLOOD WORK (no food or drink after midnight before the lab appointment, only water or coffee without cream/sugar on the morning of)  SCHEDULE "Lab Only" visit in the morning at the clinic for lab draw in [place]  - Make sure Lab Only appointment is at about 1 week before your next appointment, so that results will be available  For Lab Results, once available within 2-3 days of blood draw, you can can log in to MyChart online to view your results and a brief explanation. Also, we can discuss results at next follow-up visit.   Please schedule a Follow-up Appointment to: Return in about 1 year (around 05/13/2021) for 1-2 year annual physical.  If you have any other questions or concerns, please feel free to call the office or send a message through [place]. You may also schedule an earlier appointment if necessary.  Additionally, you may be receiving a survey about your experience at our office within a few days to 1 week by e-mail or mail. We value your feedback.  Nobie Putnam, DO [place]

## 2020-05-13 NOTE — Progress Notes (Signed)
Subjective:    Patient ID: David Jimenez, male    DOB: 07-11-1970, 50 y.o.   MRN: [id number]  David Jimenez is a 50 y.o. male presenting on 05/13/2020 for Gastroesophageal Reflux  Patient is self pay. Here for yearly follow-up  HPI   Lifestyle /Overweight BMI >28 Weight is stable 197 lbs at home w/o clothes Diet - improved diet, see below No alcohol in past >6-8 months Exercise - active at work, goal to resume work out  HYPERLIPIDEMIA: - Reports no concerns. Last lipid panel6/2020,mild elevated LDL 140s and low HDL 30s - In past he tried Statin >5 years ago for similar cholesterol result, he had side effect reaction myalgias intolerance, not interested in resuming.  Osteoarthritis Overall much improved He has history of "inflammation" He has improved diet to limit this side effect. No longer taking Ibuprofen regularly. He may take a dose of Excedrin migraine PRN for headaches. He takes Turmeric  Vitamin D, VItamin C, Super Food Supplement  GERD vs PUD Since last visit 2020, has improved lifestyle diet, GERD/PUD has dramatically improved now, he is no longer on regular dose PPI omeprazole. He takes occasional OTC Prilosec PRN.  History of COVID Delta Variant in 2021 He admits some occasional "lung inflammation" at times, he uses humidifier   Health Maintenance: Due for Tdap, can get booster in future, due q 10 yr  Consider cologuard, at age 68, declines now. He will consider his options, may do self pay.  Prostate CA Screening - due for PSA test today initial screen, asymptomatic. No BPH or other LUTS. No fam history of Prostate CA.  Due for Flu Shot, declines today despite counseling on benefits    Depression screen [facility] 2/9 05/13/2020 10/30/2018 07/31/2018  Decreased Interest 0 0 0  Down, Depressed, Hopeless 0 0 0  PHQ - 2 Score 0 0 0    History reviewed. No pertinent past medical history. History reviewed. No pertinent surgical history. Social History    Socioeconomic History  . Marital status: Married    Spouse name: Not on file  . Number of children: Not on file  . Years of education: Not on file  . Highest education level: Not on file  Occupational History  . Not on file  Tobacco Use  . Smoking status: Former Smoker    Quit date: 07/30/2004    Years since quitting: 15.7  . Smokeless tobacco: Former Clinical biochemist  . Vaping Use: Never used  Substance and Sexual Activity  . Alcohol use: Yes    Comment: occassional  . Drug use: Never  . Sexual activity: Not on file  Other Topics Concern  . Not on file  Social History Narrative  . Not on file   Social Determinants of Health   Financial Resource Strain: Not on file  Food Insecurity: Not on file  Transportation Needs: Not on file  Physical Activity: Not on file  Stress: Not on file  Social Connections: Not on file  Intimate Partner Violence: Not on file   Family History  Problem Relation Age of Onset  . Congestive Heart Failure Father   . Congestive Heart Failure Paternal Uncle    Current Outpatient Medications on File Prior to Visit  Medication Sig  . omeprazole (PRILOSEC) 20 MG capsule Take 20 mg by mouth daily as needed. OTC PRN   No current facility-administered medications on file prior to visit.    Review of Systems  Constitutional: Negative for activity change, appetite  change, chills, diaphoresis, fatigue and fever.  HENT: Negative for congestion and hearing loss.   Eyes: Negative for visual disturbance.  Respiratory: Negative for cough, chest tightness, shortness of breath and wheezing.   Cardiovascular: Negative for chest pain, palpitations and leg swelling.  Gastrointestinal: Negative for abdominal pain, constipation, diarrhea, nausea and vomiting.  Endocrine: Negative for cold intolerance.  Genitourinary: Negative for dysuria, frequency and hematuria.  Musculoskeletal: Negative for arthralgias and neck pain.  Skin: Negative for rash.   Allergic/Immunologic: Negative for environmental allergies.  Neurological: Negative for dizziness, weakness, light-headedness, numbness and headaches.  Hematological: Negative for adenopathy.  Psychiatric/Behavioral: Negative for behavioral problems, dysphoric mood and sleep disturbance.   Per HPI unless specifically indicated above     Objective:    BP 120/68   Pulse 63   Ht 6' (1.829 m)   Wt 208 lb (94.3 kg)   SpO2 99%   BMI 28.21 kg/m   Wt Readings from Last 3 Encounters:  05/13/20 208 lb (94.3 kg)  10/30/18 200 lb 12.8 oz (91.1 kg)    Physical Exam Vitals and nursing note reviewed.  Constitutional:      General: He is not in acute distress.    Appearance: He is well-developed and well-nourished. He is not diaphoretic.     Comments: Well-appearing, comfortable, cooperative  HENT:     Head: Normocephalic and atraumatic.     Right Ear: Tympanic membrane, ear canal and external ear normal. There is no impacted cerumen.     Left Ear: Tympanic membrane, ear canal and external ear normal. There is impacted cerumen.     Mouth/Throat:     Mouth: Oropharynx is clear and moist.  Eyes:     General:        Right eye: No discharge.        Left eye: No discharge.     Extraocular Movements: EOM normal.     Conjunctiva/sclera: Conjunctivae normal.     Pupils: Pupils are equal, round, and reactive to light.  Neck:     Thyroid: No thyromegaly.  Cardiovascular:     Rate and Rhythm: Normal rate and regular rhythm.     Pulses: Intact distal pulses.     Heart sounds: Normal heart sounds. No murmur heard.   Pulmonary:     Effort: Pulmonary effort is normal. No respiratory distress.     Breath sounds: Normal breath sounds. No wheezing or rales.  Abdominal:     General: Bowel sounds are normal. There is no distension.     Palpations: Abdomen is soft. There is no mass.     Tenderness: There is no abdominal tenderness.  Musculoskeletal:        General: No tenderness or edema. Normal  range of motion.     Cervical back: Normal range of motion and neck supple.     Right lower leg: No edema.     Left lower leg: No edema.     Comments: Upper / Lower Extremities: - Normal muscle tone, strength bilateral upper extremities 5/5, lower extremities 5/5  Lymphadenopathy:     Cervical: No cervical adenopathy.  Skin:    General: Skin is warm and dry.     Findings: No erythema or rash.  Neurological:     Mental Status: He is alert and oriented to person, place, and time.     Comments: Distal sensation intact to light touch all extremities  Psychiatric:        Mood and Affect: Mood and affect  normal.        Behavior: Behavior normal.     Comments: Well groomed, good eye contact, normal speech and thoughts      Results for orders placed or performed in visit on 10/24/18  Lipid panel  Result Value Ref Range   Cholesterol 212 (H) <200 mg/dL   HDL 36 (L) > OR = 40 mg/dL   Triglycerides 161166 (H) <150 mg/dL   LDL Cholesterol (Calc) 146 (H) mg/dL (calc)   Total CHOL/HDL Ratio 5.9 (H) <5.0 (calc)   Non-HDL Cholesterol (Calc) 176 (H) <130 mg/dL (calc)  CBC with Differential  Result Value Ref Range   WBC 10.9 (H) 3.8 - 10.8 Thousand/uL   RBC 5.77 4.20 - 5.80 Million/uL   Hemoglobin 16.3 13.2 - 17.1 g/dL   HCT 09.649.8 04.538.5 - 40.950.0 %   MCV 86.3 80.0 - 100.0 fL   MCH 28.2 27.0 - 33.0 pg   MCHC 32.7 32.0 - 36.0 g/dL   RDW 81.113.5 91.411.0 - 78.215.0 %   Platelets 374 140 - 400 Thousand/uL   MPV 9.8 7.5 - 12.5 fL   Neutro Abs 6,180 1,500 - 7,800 cells/uL   Lymphs Abs 2,627 850 - 3,900 cells/uL   Absolute Monocytes 937 200 - 950 cells/uL   Eosinophils Absolute 1,057 (H) 15 - 500 cells/uL   Basophils Absolute 98 0 - 200 cells/uL   Neutrophils Relative % 56.7 %   Total Lymphocyte 24.1 %   Monocytes Relative 8.6 %   Eosinophils Relative 9.7 %   Basophils Relative 0.9 %  Hemoglobin A1c  Result Value Ref Range   Hgb A1c MFr Bld 5.4 <5.7 % of total Hgb   Mean Plasma Glucose 108 (calc)   eAG  (mmol/L) 6.0 (calc)      Assessment & Plan:   Problem List Items Addressed This Visit    Overweight (BMI 25.0-29.9)   Relevant Orders   Hemoglobin A1c   CBC with Differential/Platelet   COMPLETE METABOLIC PANEL WITH GFR   Lipid panel   Testosterone   GERD (gastroesophageal reflux disease) - Primary   Relevant Medications   omeprazole (PRILOSEC) 20 MG capsule   Other Relevant Orders   CBC with Differential/Platelet   COMPLETE METABOLIC PANEL WITH GFR   Erectile dysfunction   Relevant Medications   sildenafil (REVATIO) 20 MG tablet    Other Visit Diagnoses    Screening for prostate cancer       Relevant Orders   PSA   Vitamin D deficiency       Relevant Orders   VITAMIN D 25 Hydroxy (Vit-D Deficiency, Fractures)      Updated Health Maintenance information - Declines Flu, COVID vaccines - Future TDap if injury - Considering Colon CA Screening possible cologuard, self pay Ordered fasting labs for patient today. - Including routine panel A1c, CBC, Lipids, CMET - He request Testosterone as screening given now age 50 - History of low Vitamin D, request check - he takes supplement - PSA Screening lab today age 51+  - Regarding GERD - maintain on occasional OTC PPI PRN not regularly, he has dramatically improved his diet lifestyle - ED - will order Sildenafil generic rx uses 1-2 he buys online, now will try goodrx. Encouraged improvement to lifestyle with diet and exercise - Goal of weight loss   Meds ordered this encounter  Medications  . sildenafil (REVATIO) 20 MG tablet    Sig: Take 1-5 pills about 30 min prior to sex. Start with 1 and  increase as needed.    Dispense:  90 tablet    Refill:  3      Follow up plan: Return in about 1 year (around 05/13/2021) for 1-2 year annual physical.  Saralyn Pilar, DO [facility] 05/13/2020, 9:03 AM

## 2020-05-14 LAB — CBC WITH DIFFERENTIAL/PLATELET
Absolute Monocytes: 763 cells/uL (ref 200–950)
Basophils Absolute: 112 cells/uL (ref 0–200)
Basophils Relative: 1.2 %
Eosinophils Absolute: 828 cells/uL — ABNORMAL HIGH (ref 15–500)
Eosinophils Relative: 8.9 %
HCT: 50.5 % — ABNORMAL HIGH (ref 38.5–50.0)
Hemoglobin: 17.3 g/dL — ABNORMAL HIGH (ref 13.2–17.1)
Lymphs Abs: 2688 cells/uL (ref 850–3900)
MCH: 28.4 pg (ref 27.0–33.0)
MCHC: 34.3 g/dL (ref 32.0–36.0)
MCV: 82.9 fL (ref 80.0–100.0)
MPV: 9.9 fL (ref 7.5–12.5)
Monocytes Relative: 8.2 %
Neutro Abs: 4910 cells/uL (ref 1500–7800)
Neutrophils Relative %: 52.8 %
Platelets: 379 10*3/uL (ref 140–400)
RBC: 6.09 10*6/uL — ABNORMAL HIGH (ref 4.20–5.80)
RDW: 13.3 % (ref 11.0–15.0)
Total Lymphocyte: 28.9 %
WBC: 9.3 10*3/uL (ref 3.8–10.8)

## 2020-05-14 LAB — COMPLETE METABOLIC PANEL WITH GFR
AG Ratio: 1.8 (calc) (ref 1.0–2.5)
ALT: 23 U/L (ref 9–46)
AST: 17 U/L (ref 10–35)
Albumin: 4.6 g/dL (ref 3.6–5.1)
Alkaline phosphatase (APISO): 83 U/L (ref 35–144)
BUN: 10 mg/dL (ref 7–25)
CO2: 25 mmol/L (ref 20–32)
Calcium: 9.5 mg/dL (ref 8.6–10.3)
Chloride: 104 mmol/L (ref 98–110)
Creat: 1.12 mg/dL (ref 0.70–1.33)
GFR, Est African American: 88 mL/min/{1.73_m2} (ref >=60)
GFR, Est Non African American: 76 mL/min/{1.73_m2} (ref >=60)
Globulin: 2.5 g/dL (calc) (ref 1.9–3.7)
Glucose, Bld: 77 mg/dL (ref 65–99)
Potassium: 4.4 mmol/L (ref 3.5–5.3)
Sodium: 140 mmol/L (ref 135–146)
Total Bilirubin: 0.6 mg/dL (ref 0.2–1.2)
Total Protein: 7.1 g/dL (ref 6.1–8.1)

## 2020-05-14 LAB — HEMOGLOBIN A1C
Hgb A1c MFr Bld: 5.7 % of total Hgb — ABNORMAL HIGH (ref <5.7)
Mean Plasma Glucose: 117 mg/dL
eAG (mmol/L): 6.5 mmol/L

## 2020-05-14 LAB — LIPID PANEL
Cholesterol: 240 mg/dL — ABNORMAL HIGH (ref <200)
HDL: 35 mg/dL — ABNORMAL LOW (ref >=40)
LDL Cholesterol (Calc): 166 mg/dL (calc) — ABNORMAL HIGH
Non-HDL Cholesterol (Calc): 205 mg/dL (calc) — ABNORMAL HIGH (ref <130)
Total CHOL/HDL Ratio: 6.9 (calc) — ABNORMAL HIGH (ref <5.0)
Triglycerides: 233 mg/dL — ABNORMAL HIGH (ref <150)

## 2020-05-14 LAB — PSA: PSA: 0.56 ng/mL (ref <=4.0)

## 2020-05-14 LAB — VITAMIN D 25 HYDROXY (VIT D DEFICIENCY, FRACTURES): Vit D, 25-Hydroxy: 35 ng/mL (ref 30–100)

## 2020-05-14 LAB — TESTOSTERONE: Testosterone: 598 ng/dL (ref 250–827)

## 2021-08-26 ENCOUNTER — Ambulatory Visit: Payer: Self-pay | Admitting: Family Medicine

## 2021-08-26 ENCOUNTER — Encounter: Payer: Self-pay | Admitting: Family Medicine

## 2021-08-26 VITALS — BP 116/70 | HR 62 | Ht 72.0 in | Wt 195.8 lb

## 2021-08-26 DIAGNOSIS — J439 Emphysema, unspecified: Secondary | ICD-10-CM

## 2021-08-26 DIAGNOSIS — J3089 Other allergic rhinitis: Secondary | ICD-10-CM

## 2021-08-26 DIAGNOSIS — E782 Mixed hyperlipidemia: Secondary | ICD-10-CM

## 2021-08-26 DIAGNOSIS — Z125 Encounter for screening for malignant neoplasm of prostate: Secondary | ICD-10-CM

## 2021-08-26 DIAGNOSIS — K219 Gastro-esophageal reflux disease without esophagitis: Secondary | ICD-10-CM

## 2021-08-26 DIAGNOSIS — E663 Overweight: Secondary | ICD-10-CM

## 2021-08-26 MED ORDER — DEXAMETHASONE 4 MG PO TABS: 4.0000 mg | ORAL_TABLET | Freq: Every day | ORAL | 0 refills | Status: DC

## 2021-08-26 MED ORDER — MONTELUKAST SODIUM 10 MG PO TABS: 10.0000 mg | ORAL_TABLET | Freq: Every day | ORAL | 3 refills | Status: DC

## 2021-08-26 NOTE — Progress Notes (Signed)
? ?Subjective:  ? ? Patient ID: David Jimenez, male    DOB: 04/10/1971, 51 y.o.   MRN: [id number] ? ?David Jimenez is a 51 y.o. male presenting on 08/26/2021 for Cough ? ? ?HPI ? ?Mild Emphysema ?Chronic Dry Cough ?Environmental Seasonal Allergies ? ?2015 chronic dry cough, has allergic response in winter time. [facility] with Emphysema or Chronic Bronchitis cough. Seems to worsen from using inhalers or steroids. ? ?Interval update in December 2022, he had COVID and later had multiple triggers with stayed in a camper with fumes and pollen. Recently had sinusitis and felt like chest congestion was bothering him. ?- He was treated at Urgent Care with Dexamethasone and Augmentin ?- He has been doing nebulizer therapy ?- Increased humidifier ? ?Lifestyle ?Weight loss up to 20 lbs in past few years. ?Recently some gained weight ?Admits eating some sweets and sodas ? ?HYPERLIPIDEMIA: ?- Reports no concerns. Last lipid panel 2022, elevated TG LDL ? ?GERD ?Has come off PPI therapy. Resolved. Symptoms improved ?Now on Probiotic ? ?[facility] ?Has had a scan of brain and evaluation ? ?Health Maintenance: ? ?No ins coverage currently. Will defer Colon CA Screening. ? ? ?  08/26/2021  ?  8:24 AM 05/13/2020  ?  9:08 AM 10/30/2018  ?  2:17 PM  ?Depression screen PHQ 2/9  ?Decreased Interest 0 0 0  ?Down, Depressed, Hopeless 0 0 0  ?PHQ - 2 Score 0 0 0  ?Altered sleeping 0    ?Tired, decreased energy 0    ?Change in appetite 0    ?Feeling bad or failure about yourself  0    ?Trouble concentrating 0    ?Moving slowly or fidgety/restless 0    ?Suicidal thoughts 0    ?PHQ-9 Score 0    ?Difficult doing work/chores Not difficult at all    ? ? ?History reviewed. No pertinent past medical history. ?History reviewed. No pertinent surgical history. ?Social History  ? ?Socioeconomic History  ? Marital status: Married  ?  Spouse name: Not on file  ? Number of children: Not on file  ? Years of education: Not on file  ?  Highest education level: Not on file  ?Occupational History  ? Not on file  ?Tobacco Use  ? Smoking status: Former  ?  Types: Cigarettes  ?  Quit date: 07/30/2004  ?  Years since quitting: 17.0  ? Smokeless tobacco: Former  ?Vaping Use  ? Vaping Use: Never used  ?Substance and Sexual Activity  ? Alcohol use: Yes  ?  Comment: occassional  ? Drug use: Never  ? Sexual activity: Not on file  ?Other Topics Concern  ? Not on file  ?Social History Narrative  ? Not on file  ? ?Social Determinants of Health  ? ?Financial Resource Strain: Not on file  ?Food Insecurity: Not on file  ?Transportation Needs: Not on file  ?Physical Activity: Not on file  ?Stress: Not on file  ?Social Connections: Not on file  ?Intimate Partner Violence: Not on file  ? ?Family History  ?Problem Relation Age of Onset  ? Congestive Heart Failure Father   ? Congestive Heart Failure Paternal Uncle   ? ?Current Outpatient Medications on File Prior to Visit  ?Medication Sig  ? sildenafil (REVATIO) 20 MG tablet Take 1-5 pills about 30 min prior to sex. Start with 1 and increase as needed.  ? ?No current facility-administered medications on file prior to visit.  ? ? ?Review of  Systems ?Per HPI unless specifically indicated above ? ? ?   ?Objective:  ?  ?BP 116/70   Pulse 62   Ht 6' (1.829 m)   Wt 195 lb 12.8 oz (88.8 kg)   SpO2 99%   BMI 26.56 kg/m?   ?Wt Readings from Last 3 Encounters:  ?08/26/21 195 lb 12.8 oz (88.8 kg)  ?05/13/20 208 lb (94.3 kg)  ?10/30/18 200 lb 12.8 oz (91.1 kg)  ?  ?Physical Exam ?Vitals and nursing note reviewed.  ?Constitutional:   ?   General: He is not in acute distress. ?   Appearance: He is well-developed. He is not diaphoretic.  ?   Comments: Well-appearing, comfortable, cooperative  ?HENT:  ?   Head: Normocephalic and atraumatic.  ?Eyes:  ?   General:     ?   Right eye: No discharge.     ?   Left eye: No discharge.  ?   Conjunctiva/sclera: Conjunctivae normal.  ?   Pupils: Pupils are equal, round, and reactive to light.   ?Neck:  ?   Thyroid: No thyromegaly.  ?   Vascular: No carotid bruit.  ?Cardiovascular:  ?   Rate and Rhythm: Normal rate and regular rhythm.  ?   Pulses: Normal pulses.  ?   Heart sounds: Normal heart sounds. No murmur heard. ?Pulmonary:  ?   Effort: Pulmonary effort is normal. No respiratory distress.  ?   Breath sounds: Normal breath sounds. No wheezing or rales.  ?Abdominal:  ?   General: Bowel sounds are normal. There is no distension.  ?   Palpations: Abdomen is soft. There is no mass.  ?   Tenderness: There is no abdominal tenderness.  ?Musculoskeletal:     ?   General: No tenderness. Normal range of motion.  ?   Cervical back: Normal range of motion and neck supple.  ?   Right lower leg: No edema.  ?   Left lower leg: No edema.  ?   Comments: Upper / Lower Extremities: ?- Normal muscle tone, strength bilateral upper extremities 5/5, lower extremities 5/5  ?Lymphadenopathy:  ?   Cervical: No cervical adenopathy.  ?Skin: ?   General: Skin is warm and dry.  ?   Findings: No erythema or rash.  ?Neurological:  ?   Mental Status: He is alert and oriented to person, place, and time.  ?   Comments: Distal sensation intact to light touch all extremities  ?Psychiatric:     ?   Mood and Affect: Mood normal.     ?   Behavior: Behavior normal.     ?   Thought Content: Thought content normal.  ?   Comments: Well groomed, good eye contact, normal speech and thoughts  ? ?Results for orders placed or performed in visit on 05/13/20  ?Hemoglobin A1c  ?Result Value Ref Range  ? Hgb A1c MFr Bld 5.7 (H) <5.7 % of total Hgb  ? Mean Plasma Glucose 117 mg/dL  ? eAG (mmol/L) 6.5 mmol/L  ?CBC with Differential/Platelet  ?Result Value Ref Range  ? WBC 9.3 3.8 - 10.8 Thousand/uL  ? RBC 6.09 (H) 4.20 - 5.80 Million/uL  ? Hemoglobin 17.3 (H) 13.2 - 17.1 g/dL  ? HCT 50.5 (H) 38.5 - 50.0 %  ? MCV 82.9 80.0 - 100.0 fL  ? MCH 28.4 27.0 - 33.0 pg  ? MCHC 34.3 32.0 - 36.0 g/dL  ? RDW 13.3 11.0 - 15.0 %  ? Platelets 379 140 - 400  Thousand/uL  ?  MPV 9.9 7.5 - 12.5 fL  ? Neutro Abs 4,910 1,500 - 7,800 cells/uL  ? Lymphs Abs 2,688 850 - 3,900 cells/uL  ? Absolute Monocytes 763 200 - 950 cells/uL  ? Eosinophils Absolute 828 (H) 15 - 500 cells/uL  ? Basophils Absolute 112 0 - 200 cells/uL  ? Neutrophils Relative % 52.8 %  ? Total Lymphocyte 28.9 %  ? Monocytes Relative 8.2 %  ? Eosinophils Relative 8.9 %  ? Basophils Relative 1.2 %  ?COMPLETE METABOLIC PANEL WITH GFR  ?Result Value Ref Range  ? Glucose, Bld 77 65 - 99 mg/dL  ? BUN 10 7 - 25 mg/dL  ? Creat 1.12 0.70 - 1.33 mg/dL  ? GFR, Est Non African American 76 > OR = 60 mL/min/1.23m2  ? GFR, Est African American 88 > OR = 60 mL/min/1.70m2  ? BUN/Creatinine Ratio NOT APPLICABLE 6 - 22 (calc)  ? Sodium 140 135 - 146 mmol/L  ? Potassium 4.4 3.5 - 5.3 mmol/L  ? Chloride 104 98 - 110 mmol/L  ? CO2 25 20 - 32 mmol/L  ? Calcium 9.5 8.6 - 10.3 mg/dL  ? Total Protein 7.1 6.1 - 8.1 g/dL  ? Albumin 4.6 3.6 - 5.1 g/dL  ? Globulin 2.5 1.9 - 3.7 g/dL (calc)  ? AG Ratio 1.8 1.0 - 2.5 (calc)  ? Total Bilirubin 0.6 0.2 - 1.2 mg/dL  ? Alkaline phosphatase (APISO) 83 35 - 144 U/L  ? AST 17 10 - 35 U/L  ? ALT 23 9 - 46 U/L  ?Lipid panel  ?Result Value Ref Range  ? Cholesterol 240 (H) <200 mg/dL  ? HDL 35 (L) > OR = 40 mg/dL  ? Triglycerides 233 (H) <150 mg/dL  ? LDL Cholesterol (Calc) 166 (H) mg/dL (calc)  ? Total CHOL/HDL Ratio 6.9 (H) <5.0 (calc)  ? Non-HDL Cholesterol (Calc) 205 (H) <130 mg/dL (calc)  ?PSA  ?Result Value Ref Range  ? PSA 0.56 < OR = 4.0 ng/mL  ?Testosterone  ?Result Value Ref Range  ? Testosterone 598 250 - 827 ng/dL  ?VITAMIN D 25 Hydroxy (Vit-D Deficiency, Fractures)  ?Result Value Ref Range  ? Vit D, 25-Hydroxy 35 30 - 100 ng/mL  ? ?   ?Assessment & Plan:  ? ?Problem List Items Addressed This Visit   ? ? Overweight (BMI 25.0-29.9)  ? Mixed hyperlipidemia  ? Relevant Orders  ? Lipid panel  ? Mild emphysema (HCC) - Primary  ? Relevant Medications  ? montelukast (SINGULAIR) 10 MG tablet  ? dexamethasone  (DECADRON) 4 MG tablet  ? Other Relevant Orders  ? COMPLETE METABOLIC PANEL WITH GFR  ? CBC with Differential/Platelet  ? GERD (gastroesophageal reflux disease)  ? ?Other Visit Diagnoses   ? ? Screening for pr

## 2021-08-26 NOTE — Patient Instructions (Addendum)
Thank you for coming to the office today. ? ?Start Singulair 10mg  nightly (generic is montelukast) for allergies breathing ? ?Future when get insurance can return to Pulmonology ? ?Printed Decadron for future if you need. ? ?Labs today ? ?Please schedule a Follow-up Appointment to: Return if symptoms worsen or fail to improve. ? ?If you have any other questions or concerns, please feel free to call the office or send a message through MyChart. You may also schedule an earlier appointment if necessary. ? ?Additionally, you may be receiving a survey about your experience at our office within a few days to 1 week by e-mail or mail. We value your feedback. ? ? , DO ?[facility], [facility] ?

## 2021-08-27 LAB — COMPLETE METABOLIC PANEL WITH GFR
AG Ratio: 1.7 (calc) (ref 1.0–2.5)
ALT: 22 U/L (ref 9–46)
AST: 12 U/L (ref 10–35)
Albumin: 4.2 g/dL (ref 3.6–5.1)
Alkaline phosphatase (APISO): 67 U/L (ref 35–144)
BUN: 14 mg/dL (ref 7–25)
CO2: 28 mmol/L (ref 20–32)
Calcium: 9.6 mg/dL (ref 8.6–10.3)
Chloride: 102 mmol/L (ref 98–110)
Creat: 1.07 mg/dL (ref 0.70–1.30)
Globulin: 2.5 g/dL (calc) (ref 1.9–3.7)
Glucose, Bld: 81 mg/dL (ref 65–99)
Potassium: 3.9 mmol/L (ref 3.5–5.3)
Sodium: 141 mmol/L (ref 135–146)
Total Bilirubin: 0.6 mg/dL (ref 0.2–1.2)
Total Protein: 6.7 g/dL (ref 6.1–8.1)
eGFR: 84 mL/min/{1.73_m2} (ref >=60)

## 2021-08-27 LAB — CBC WITH DIFFERENTIAL/PLATELET
Absolute Monocytes: 1586 cells/uL — ABNORMAL HIGH (ref 200–950)
Basophils Absolute: 103 cells/uL (ref 0–200)
Basophils Relative: 0.5 %
Eosinophils Absolute: 165 cells/uL (ref 15–500)
Eosinophils Relative: 0.8 %
HCT: 48.2 % (ref 38.5–50.0)
Hemoglobin: 15.6 g/dL (ref 13.2–17.1)
Lymphs Abs: 6262 cells/uL — ABNORMAL HIGH (ref 850–3900)
MCH: 28.4 pg (ref 27.0–33.0)
MCHC: 32.4 g/dL (ref 32.0–36.0)
MCV: 87.6 fL (ref 80.0–100.0)
MPV: 9.7 fL (ref 7.5–12.5)
Monocytes Relative: 7.7 %
Neutro Abs: 12484 cells/uL — ABNORMAL HIGH (ref 1500–7800)
Neutrophils Relative %: 60.6 %
Platelets: 425 10*3/uL — ABNORMAL HIGH (ref 140–400)
RBC: 5.5 10*6/uL (ref 4.20–5.80)
RDW: 13.4 % (ref 11.0–15.0)
Total Lymphocyte: 30.4 %
WBC: 20.6 10*3/uL — ABNORMAL HIGH (ref 3.8–10.8)

## 2021-08-27 LAB — LIPID PANEL
Cholesterol: 188 mg/dL (ref <200)
HDL: 47 mg/dL (ref >=40)
LDL Cholesterol (Calc): 113 mg/dL (calc) — ABNORMAL HIGH
Non-HDL Cholesterol (Calc): 141 mg/dL (calc) — ABNORMAL HIGH (ref <130)
Total CHOL/HDL Ratio: 4 (calc) (ref <5.0)
Triglycerides: 163 mg/dL — ABNORMAL HIGH (ref <150)

## 2021-08-27 LAB — PSA: PSA: 0.37 ng/mL (ref <=4.00)

## 2022-01-12 ENCOUNTER — Telehealth (INDEPENDENT_AMBULATORY_CARE_PROVIDER_SITE_OTHER): Payer: Self-pay | Admitting: Family Medicine

## 2022-01-12 ENCOUNTER — Encounter: Payer: Self-pay | Admitting: Family Medicine

## 2022-01-12 VITALS — Ht 72.0 in | Wt 195.0 lb

## 2022-01-12 DIAGNOSIS — N529 Male erectile dysfunction, unspecified: Secondary | ICD-10-CM

## 2022-01-12 DIAGNOSIS — F411 Generalized anxiety disorder: Secondary | ICD-10-CM

## 2022-01-12 DIAGNOSIS — F410 Panic disorder [episodic paroxysmal anxiety] without agoraphobia: Secondary | ICD-10-CM

## 2022-01-12 MED ORDER — BUSPIRONE HCL 10 MG PO TABS: 5.0000 mg | ORAL_TABLET | Freq: Three times a day (TID) | ORAL | 1 refills | Status: DC | PRN

## 2022-01-12 MED ORDER — SILDENAFIL CITRATE 20 MG PO TABS: ORAL_TABLET | ORAL | 5 refills | Status: DC

## 2022-01-12 MED ORDER — ESCITALOPRAM OXALATE 10 MG PO TABS: 10.0000 mg | ORAL_TABLET | Freq: Every day | ORAL | 1 refills | Status: DC

## 2022-01-12 NOTE — Patient Instructions (Addendum)
Start Escitalopram half tab for 5mg  daily, 2-4 weeks for full effect. May dose increase to 10mg  whole tablet if needed after 2-4 weeks. Goal to take 6-12 months or longer. Daily to help control anxiety. Wean off if stop  Add Buspar (Buspirone) 10mg , same dosing, you can take HALF tab for 5mg  at first to trial it. You can take this one more frequently AS NEEDED for anxiety or panic, every 6-8 hours or 3 times a day max. No need to wean  Re ordered Sildenafil   Please schedule a Follow-up Appointment to: Return in about 3 months (around 04/13/2022) for 3 month anxiety med follow up.  If you have any other questions or concerns, please feel free to call the office or send a message through MyChart. You may also schedule an earlier appointment if necessary.  Additionally, you may be receiving a survey about your experience at our office within a few days to 1 week by e-mail or mail. We value your feedback.  , DO [facility], 

## 2022-01-12 NOTE — Progress Notes (Addendum)
Subjective:    Patient ID: David Jimenez, male    DOB: [birth year], 51 y.o.   MRN: [id number]  David Jimenez is a 51 y.o. male presenting on 01/12/2022 for Anxiety and Erectile Dysfunction  Self / Self Pay  Virtual / Telehealth Encounter - Video Visit via MyChart The purpose of this virtual visit is to provide medical care while limiting exposure to the novel coronavirus (COVID19) for both patient and office staff.  Consent was obtained for remote visit:  Yes.   Answered questions that patient had about telehealth interaction:  Yes.   I discussed the limitations, risks, security and privacy concerns of performing an evaluation and management service by video/telephone. I also discussed with the patient that there may be a patient responsible charge related to this service. The patient expressed understanding and agreed to proceed.  Patient Location: Work Provider Location: [employer] (Office)  Participants in virtual visit: - Patient: Chrisopher Pustejovsky - CMA: Burnell Blanks, CMA - Provider: Dr Althea Charon   HPI  Generalized Anxiety Disorder with Panic Attacks He has reported significant anxiety developed over past year with multiple stressors, primarily since March 2023 Infidelity that he was involved with and he is still with together and they are working together on moving forward but it is still stressful, he has been to the  [facility] and brain scan and counseling, also admits company self employed has grown Work stress with higher risk financial He has tried Meditation Episodic panic attack some days worse than others, often triggers with his relationship stress Interested in short term medication Not depressed Never on prior med Wt loss 20 lbs Cholesterol down He is doing well otherwise  Erectile Dysfunction Needs re order Sildenafil      08/26/2021    8:24 AM 05/13/2020    9:08 AM 10/30/2018    2:17 PM  Depression screen PHQ 2/9  Decreased  Interest 0 0 0  Down, Depressed, Hopeless 0 0 0  PHQ - 2 Score 0 0 0  Altered sleeping 0    Tired, decreased energy 0    Change in appetite 0    Feeling bad or failure about yourself  0    Trouble concentrating 0    Moving slowly or fidgety/restless 0    Suicidal thoughts 0    PHQ-9 Score 0    Difficult doing work/chores Not difficult at all      Social History   Tobacco Use   Smoking status: Former    Types: Cigarettes    Quit date: 07/30/2004    Years since quitting: 17.4   Smokeless tobacco: Former  Building services engineer Use: Never used  Substance Use Topics   Alcohol use: Yes    Comment: occassional   Drug use: Never    Review of Systems Per HPI unless specifically indicated above     Objective:    Ht 6' (1.829 m)   Wt 195 lb (88.5 kg)   BMI 26.45 kg/m   Wt Readings from Last 3 Encounters:  01/12/22 195 lb (88.5 kg)  08/26/21 195 lb 12.8 oz (88.8 kg)  05/13/20 208 lb (94.3 kg)    Physical Exam  Note examination was completely remotely via video observation objective data only  Gen - well-appearing, no acute distress or apparent pain, comfortable HEENT - eyes appear clear without discharge or redness Heart/Lungs - cannot examine virtually - observed no evidence of coughing or labored breathing. Abd - cannot  examine virtually  Skin - face visible today- no rash Neuro - awake, alert, oriented Psych - not anxious appearing   Results for orders placed or performed in visit on 08/26/21  PSA  Result Value Ref Range   PSA 0.37 < OR = 4.00 ng/mL  COMPLETE METABOLIC PANEL WITH GFR  Result Value Ref Range   Glucose, Bld 81 65 - 99 mg/dL   BUN 14 7 - 25 mg/dL   Creat 1.07 0.70 - 1.30 mg/dL   eGFR 84 > OR = 60 mL/min/1.69m2   BUN/Creatinine Ratio NOT APPLICABLE 6 - 22 (calc)   Sodium 141 135 - 146 mmol/L   Potassium 3.9 3.5 - 5.3 mmol/L   Chloride 102 98 - 110 mmol/L   CO2 28 20 - 32 mmol/L   Calcium 9.6 8.6 - 10.3 mg/dL   Total Protein 6.7 6.1 - 8.1 g/dL    Albumin 4.2 3.6 - 5.1 g/dL   Globulin 2.5 1.9 - 3.7 g/dL (calc)   AG Ratio 1.7 1.0 - 2.5 (calc)   Total Bilirubin 0.6 0.2 - 1.2 mg/dL   Alkaline phosphatase (APISO) 67 35 - 144 U/L   AST 12 10 - 35 U/L   ALT 22 9 - 46 U/L  CBC with Differential/Platelet  Result Value Ref Range   WBC 20.6 (H) 3.8 - 10.8 Thousand/uL   RBC 5.50 4.20 - 5.80 Million/uL   Hemoglobin 15.6 13.2 - 17.1 g/dL   HCT 48.2 38.5 - 50.0 %   MCV 87.6 80.0 - 100.0 fL   MCH 28.4 27.0 - 33.0 pg   MCHC 32.4 32.0 - 36.0 g/dL   RDW 13.4 11.0 - 15.0 %   Platelets 425 (H) 140 - 400 Thousand/uL   MPV 9.7 7.5 - 12.5 fL   Neutro Abs 12,484 (H) 1,500 - 7,800 cells/uL   Lymphs Abs 6,262 (H) 850 - 3,900 cells/uL   Absolute Monocytes 1,586 (H) 200 - 950 cells/uL   Eosinophils Absolute 165 15 - 500 cells/uL   Basophils Absolute 103 0 - 200 cells/uL   Neutrophils Relative % 60.6 %   Total Lymphocyte 30.4 %   Monocytes Relative 7.7 %   Eosinophils Relative 0.8 %   Basophils Relative 0.5 %  Lipid panel  Result Value Ref Range   Cholesterol 188 <200 mg/dL   HDL 47 > OR = 40 mg/dL   Triglycerides 163 (H) <150 mg/dL   LDL Cholesterol (Calc) 113 (H) mg/dL (calc)   Total CHOL/HDL Ratio 4.0 <5.0 (calc)   Non-HDL Cholesterol (Calc) 141 (H) <130 mg/dL (calc)      Assessment & Plan:   Problem List Items Addressed This Visit     Erectile dysfunction   Relevant Medications   sildenafil (REVATIO) 20 MG tablet   Other Visit Diagnoses     Generalized anxiety disorder with panic attacks    -  Primary   Relevant Medications   escitalopram (LEXAPRO) 10 MG tablet   busPIRone (BUSPAR) 10 MG tablet       Start Escitalopram half tab for 5mg  daily, 2-4 weeks for full effect. May dose increase to 10mg  whole tablet if needed after 2-4 weeks. Goal to take 6-12 months or longer. Daily to help control anxiety. Wean off if stop  Add Buspar (Buspirone) 10mg , same dosing, you can take HALF tab for 5mg  at first to trial it. You can take  this one more frequently AS NEEDED for anxiety or panic, every 6-8 hours or 3 times a day  max. No need to wean  Discussed other meds, he asks about Ativan Valium, I advised risks of BDZ with dependence, and we will defer these for now, future reconsider very low dose for panic attack only short term.  Re ordered Sildenafil   Meds ordered this encounter  Medications   escitalopram (LEXAPRO) 10 MG tablet    Sig: Take 1 tablet (10 mg total) by mouth daily. Start with half tab $Remove'5mg'NelvqSM$  daily for 2-4 weeks then may increase to $RemoveBef'10mg'mcLTRKIcyH$ .    Dispense:  90 tablet    Refill:  1   busPIRone (BUSPAR) 10 MG tablet    Sig: Take 0.5-1 tablets (5-10 mg total) by mouth 3 (three) times daily as needed (anxiety panic).    Dispense:  270 tablet    Refill:  1   sildenafil (REVATIO) 20 MG tablet    Sig: Take 1-5 pills about 30 min prior to sex. Start with 1 and increase as needed.    Dispense:  90 tablet    Refill:  5      Follow up plan: Return in about 3 months (around 04/13/2022) for 3 month anxiety med follow up.   Patient verbalizes understanding with the above medical recommendations including the limitation of remote medical advice.  Specific follow-up and call-back criteria were given for patient to follow-up or seek medical care more urgently if needed.  Total duration of direct patient care provided via video conference: 22 minutes   Nobie Putnam, [facility] 01/12/2022, 11:30 AM

## 2022-09-16 ENCOUNTER — Other Ambulatory Visit: Payer: Self-pay | Admitting: Family Medicine

## 2022-09-16 DIAGNOSIS — N529 Male erectile dysfunction, unspecified: Secondary | ICD-10-CM

## 2022-09-16 NOTE — Telephone Encounter (Signed)
Unable to refill per protocol, Rx request is too soon. Last refill 01/12/22 for 90 and 5 refills.  Requested Prescriptions  Pending Prescriptions Disp Refills   sildenafil (REVATIO) 20 MG tablet [Pharmacy Med Name: Sildenafil Citrate 20 MG Oral Tablet] 90 tablet 0    Sig: TAKE 1 TO 5 TABLETS BY MOUTH ABOUT 30 MINUTES PRIOR TO SEX. START WITH 1 TABLET AND INCREASE AS NEEDED     Urology: Erectile Dysfunction Agents Failed - 09/16/2022  6:52 AM      Failed - AST in normal range and within 360 days    AST  Date Value Ref Range Status  08/26/2021 12 10 - 35 U/L Final         Failed - ALT in normal range and within 360 days    ALT  Date Value Ref Range Status  08/26/2021 22 9 - 46 U/L Final         Passed - Last BP in normal range    BP Readings from Last 1 Encounters:  08/26/21 116/70         Passed - Valid encounter within last 12 months    Recent Outpatient Visits           8 months ago Generalized anxiety disorder with panic attacks   [facility] Smitty Cords, DO   1 year ago Mild emphysema [facility])   [facility] [place], Netta Neat, DO   2 years ago Gastroesophageal reflux disease, unspecified whether esophagitis present   [facility] Smitty Cords, DO   3 years ago Annual physical exam   [facility] Smitty Cords, DO   4 years ago PUD (peptic ulcer disease)   [facility] [place], Netta Neat, [place]

## 2023-06-07 ENCOUNTER — Ambulatory Visit (INDEPENDENT_AMBULATORY_CARE_PROVIDER_SITE_OTHER): Payer: Self-pay | Admitting: Family Medicine

## 2023-06-07 ENCOUNTER — Encounter: Payer: Self-pay | Admitting: Family Medicine

## 2023-06-07 VITALS — BP 132/74 | HR 88 | Ht 72.0 in | Wt 215.0 lb

## 2023-06-07 DIAGNOSIS — F410 Panic disorder [episodic paroxysmal anxiety] without agoraphobia: Secondary | ICD-10-CM

## 2023-06-07 DIAGNOSIS — E782 Mixed hyperlipidemia: Secondary | ICD-10-CM

## 2023-06-07 DIAGNOSIS — Z125 Encounter for screening for malignant neoplasm of prostate: Secondary | ICD-10-CM

## 2023-06-07 DIAGNOSIS — E663 Overweight: Secondary | ICD-10-CM

## 2023-06-07 DIAGNOSIS — F411 Generalized anxiety disorder: Secondary | ICD-10-CM

## 2023-06-07 DIAGNOSIS — N529 Male erectile dysfunction, unspecified: Secondary | ICD-10-CM

## 2023-06-07 DIAGNOSIS — J439 Emphysema, unspecified: Secondary | ICD-10-CM

## 2023-06-07 DIAGNOSIS — R7309 Other abnormal glucose: Secondary | ICD-10-CM

## 2023-06-07 MED ORDER — ALBUTEROL SULFATE (2.5 MG/3ML) 0.083% IN NEBU: 2.5000 mg | INHALATION_SOLUTION | Freq: Four times a day (QID) | RESPIRATORY_TRACT | 3 refills | Status: AC | PRN

## 2023-06-07 MED ORDER — TADALAFIL 20 MG PO TABS: 20.0000 mg | ORAL_TABLET | ORAL | 2 refills | Status: AC | PRN

## 2023-06-07 MED ORDER — BUSPIRONE HCL 10 MG PO TABS: 5.0000 mg | ORAL_TABLET | Freq: Two times a day (BID) | ORAL | 3 refills | Status: AC | PRN

## 2023-06-07 NOTE — Patient Instructions (Addendum)
 Thank you for coming to the office today.  Switch to Tadalafil  20mg  every 48 hours as needed.  BP improved  Re order Buspar   Consider Cologuard vs Colonoscopy  Check with GI specialist to find out any options of charity care for uninsured patient.  [facility]) [address] - [address] [place], [place] [address] Phone: [phone]   You have been referred for a Coronary Calcium Score Cardiac CT Scan. This is a screening test for patients aged 38-50+ with cardiovascular risk factors or who are healthy but would be interested in Cardiovascular Screening for heart disease. Even if there is a family history of heart disease, this imaging can be useful. Typically it can be done every 5+ years or at a different timeline we agree on  The scan will look at the chest and mainly focus on the heart and identify early signs of calcium build up or blockages within the heart arteries. It is not 100% accurate for identifying blockages or heart disease, but it is useful to help us  predict who may have some early changes or be at risk in the future for a heart attack or cardiovascular problem.  The results are reviewed by a Cardiologist and they will document the results. It should become available on MyChart. Typically the results are divided into percentiles based on other patients of the same demographic and age. So it will compare your risk to others similar to you. If you have a higher score >99 or higher percentile >75%tile, it is recommended to consider Statin cholesterol therapy and or referral to Cardiologist. I will try to help explain your results and if we have questions we can contact the Cardiologist.  You will be contacted for scheduling. Usually it is done at any imaging facility through [facility], [facility] or [facility].  The cost is $99 flat fee total and it does not go through insurance, so no authorization is  required.    Please schedule a Follow-up Appointment to: Return for 1 year fasting lab > 1 week later Annual Physical.  If you have any other questions or concerns, please feel free to call the office or send a message through MyChart. You may also schedule an earlier appointment if necessary.  Additionally, you may be receiving a survey about your experience at our office within a few days to 1 week by e-mail or mail. We value your feedback.  Marsa Officer, DO [facility], [place]

## 2023-06-07 NOTE — Progress Notes (Signed)
 Subjective:    Patient ID: David Jimenez, male    DOB: 1970/09/17, 52 y.o.   MRN: [id number]  David Jimenez is a 52 y.o. male presenting on 06/07/2023 for Hyperlipidemia   HPI  Discussed the use of AI scribe software for clinical note transcription with the patient, who gave verbal consent to proceed.  History of Present Illness    Emphysema He describes his respiratory symptoms as 'normal wintertime lungs' and uses a home nebulizer with saline. He is interested in adding albuterol  to his nebulizer treatments. He quit smoking 15 years ago and feels his respiratory health has improved since living away from his ex-wife's animals. He uses a humidifier in his bedroom, maintaining humidity around 60%, which he finds beneficial. He has a history of being diagnosed with the beginning stages of COPD but felt that inhalers worsened his condition.  Cardiovascular Health Elevated BP Hyperlipidemia He is concerned about his circulation, noting that his skin takes longer to heal. He has a family history of congestive heart failure; his father died from it, and his uncle is currently affected. He has been working on his cardiovascular health by reducing cholesterol and has installed a home gym. He is concerned that increased consumption of eggs and beef might be affecting his circulation. His blood pressure was recorded at 144/90 during triage, which he found surprising as he has never had blood pressure issues before. He notes that his feet are often cold.  ED He reports issues with erectile function, noting that erections  can be obtained but not maintained during intercourse. He has previously used sildenafil  but is interested in tadalafil  for its longer duration of action. He mentions that his testosterone levels were nearly 600 a few years ago and feels they are adequate.  Elevated Sugar History He mentions a history of prediabetes years prior with lab A1c elevated to 5.7, mild result. He  admits inc intake sweets and he has improved now does not consume processed foods  Anxiety insomnia Improved on Buspar       Health Maintenance:   Consider cologuard,        06/07/2023    9:20 AM 08/26/2021    8:24 AM 05/13/2020    9:08 AM  Depression screen PHQ 2/9  Decreased Interest 0 0 0  Down, Depressed, Hopeless 0 0 0  PHQ - 2 Score 0 0 0  Altered sleeping 0 0   Tired, decreased energy 0 0   Change in appetite 0 0   Feeling bad or failure about yourself  0 0   Trouble concentrating 0 0   Moving slowly or fidgety/restless 0 0   Suicidal thoughts 0 0   PHQ-9 Score 0 0   Difficult doing work/chores Not difficult at all Not difficult at all        06/07/2023    9:21 AM 08/26/2021    8:24 AM  GAD 7 : Generalized Anxiety Score  Nervous, Anxious, on Edge 0 0  Control/stop worrying 0 0  Worry too much - different things 0 0  Trouble relaxing 1 0  Restless 0 0  Easily annoyed or irritable 0 0  Afraid - awful might happen 0 0  Total GAD 7 Score 1 0  Anxiety Difficulty Not difficult at all Not difficult at all     History reviewed. No pertinent past medical history. History reviewed. No pertinent surgical history. Social History   Socioeconomic History   Marital status: Married    Spouse  name: Not on file   Number of children: Not on file   Years of education: Not on file   Highest education level: Not on file  Occupational History   Not on file  Tobacco Use   Smoking status: Former    Current packs/day: 0.00    Types: Cigarettes    Quit date: 07/30/2004    Years since quitting: 18.8   Smokeless tobacco: Former  Building Services Engineer status: Never Used  Substance and Sexual Activity   Alcohol use: Yes    Comment: occassional   Drug use: Never   Sexual activity: Not on file  Other Topics Concern   Not on file  Social History Narrative   Not on file   Social Drivers of Health   Financial Resource Strain: Not on file  Food Insecurity: Not on file   Transportation Needs: Not on file  Physical Activity: Not on file  Stress: Not on file  Social Connections: Not on file  Intimate Partner Violence: Not on file   Family History  Problem Relation Age of Onset   Congestive Heart Failure Father    Congestive Heart Failure Paternal Uncle    No current outpatient medications on file prior to visit.   No current facility-administered medications on file prior to visit.    Review of Systems  Constitutional:  Negative for activity change, appetite change, chills, diaphoresis, fatigue and fever.  HENT:  Negative for congestion and hearing loss.   Eyes:  Negative for visual disturbance.  Respiratory:  Negative for cough, chest tightness, shortness of breath and wheezing.   Cardiovascular:  Negative for chest pain, palpitations and leg swelling.  Gastrointestinal:  Negative for abdominal pain, constipation, diarrhea, nausea and vomiting.  Genitourinary:  Negative for dysuria, frequency and hematuria.  Musculoskeletal:  Negative for arthralgias and neck pain.  Skin:  Negative for rash.  Neurological:  Negative for dizziness, weakness, light-headedness, numbness and headaches.  Hematological:  Negative for adenopathy.  Psychiatric/Behavioral:  Negative for behavioral problems, dysphoric mood and sleep disturbance.    Per HPI unless specifically indicated above     Objective:    BP 132/74 (BP Location: Left Arm, Cuff Size: Normal)   Pulse 88   Ht 6' (1.829 m)   Wt 215 lb (97.5 kg)   SpO2 97%   BMI 29.16 kg/m   Wt Readings from Last 3 Encounters:  06/07/23 215 lb (97.5 kg)  01/12/22 195 lb (88.5 kg)  08/26/21 195 lb 12.8 oz (88.8 kg)    Physical Exam Vitals and nursing note reviewed.  Constitutional:      General: He is not in acute distress.    Appearance: He is well-developed. He is not diaphoretic.     Comments: Well-appearing, comfortable, cooperative  HENT:     Head: Normocephalic and atraumatic.  Eyes:     General:         Right eye: No discharge.        Left eye: No discharge.     Conjunctiva/sclera: Conjunctivae normal.     Pupils: Pupils are equal, round, and reactive to light.  Neck:     Thyroid: No thyromegaly.     Vascular: No carotid bruit.  Cardiovascular:     Rate and Rhythm: Normal rate and regular rhythm.     Pulses: Normal pulses.     Heart sounds: Normal heart sounds. No murmur heard. Pulmonary:     Effort: Pulmonary effort is normal. No respiratory distress.  Breath sounds: Normal breath sounds. No wheezing or rales.  Abdominal:     General: Bowel sounds are normal. There is no distension.     Palpations: Abdomen is soft. There is no mass.     Tenderness: There is no abdominal tenderness.  Musculoskeletal:        General: No tenderness. Normal range of motion.     Cervical back: Normal range of motion and neck supple.     Right lower leg: No edema.     Left lower leg: No edema.     Comments: Upper / Lower Extremities: - Normal muscle tone, strength bilateral upper extremities 5/5, lower extremities 5/5  Lymphadenopathy:     Cervical: No cervical adenopathy.  Skin:    General: Skin is warm and dry.     Findings: No erythema or rash.  Neurological:     Mental Status: He is alert and oriented to person, place, and time.     Comments: Distal sensation intact to light touch all extremities  Psychiatric:        Mood and Affect: Mood normal.        Behavior: Behavior normal.        Thought Content: Thought content normal.     Comments: Well groomed, good eye contact, normal speech and thoughts     Results for orders placed or performed in visit on 08/26/21  PSA   Collection Time: 08/26/21  8:56 AM  Result Value Ref Range   PSA 0.37 < OR = 4.00 ng/mL  COMPLETE METABOLIC PANEL WITH GFR   Collection Time: 08/26/21  8:56 AM  Result Value Ref Range   Glucose, Bld 81 65 - 99 mg/dL   BUN 14 7 - 25 mg/dL   Creat 8.92 9.29 - 8.69 mg/dL   eGFR 84 > OR = 60 fO/fpw/8.26f7    BUN/Creatinine Ratio NOT APPLICABLE 6 - 22 (calc)   Sodium 141 135 - 146 mmol/L   Potassium 3.9 3.5 - 5.3 mmol/L   Chloride 102 98 - 110 mmol/L   CO2 28 20 - 32 mmol/L   Calcium 9.6 8.6 - 10.3 mg/dL   Total Protein 6.7 6.1 - 8.1 g/dL   Albumin 4.2 3.6 - 5.1 g/dL   Globulin 2.5 1.9 - 3.7 g/dL (calc)   AG Ratio 1.7 1.0 - 2.5 (calc)   Total Bilirubin 0.6 0.2 - 1.2 mg/dL   Alkaline phosphatase (APISO) 67 35 - 144 U/L   AST 12 10 - 35 U/L   ALT 22 9 - 46 U/L  CBC with Differential/Platelet   Collection Time: 08/26/21  8:56 AM  Result Value Ref Range   WBC 20.6 (H) 3.8 - 10.8 Thousand/uL   RBC 5.50 4.20 - 5.80 Million/uL   Hemoglobin 15.6 13.2 - 17.1 g/dL   HCT 51.7 61.4 - 49.9 %   MCV 87.6 80.0 - 100.0 fL   MCH 28.4 27.0 - 33.0 pg   MCHC 32.4 32.0 - 36.0 g/dL   RDW 86.5 88.9 - 84.9 %   Platelets 425 (H) 140 - 400 Thousand/uL   MPV 9.7 7.5 - 12.5 fL   Neutro Abs 12,484 (H) 1,500 - 7,800 cells/uL   Lymphs Abs 6,262 (H) 850 - 3,900 cells/uL   Absolute Monocytes 1,586 (H) 200 - 950 cells/uL   Eosinophils Absolute 165 15 - 500 cells/uL   Basophils Absolute 103 0 - 200 cells/uL   Neutrophils Relative % 60.6 %   Total Lymphocyte 30.4 %   Monocytes  Relative 7.7 %   Eosinophils Relative 0.8 %   Basophils Relative 0.5 %  Lipid panel   Collection Time: 08/26/21  8:56 AM  Result Value Ref Range   Cholesterol 188 <200 mg/dL   HDL 47 > OR = 40 mg/dL   Triglycerides 836 (H) <150 mg/dL   LDL Cholesterol (Calc) 113 (H) mg/dL (calc)   Total CHOL/HDL Ratio 4.0 <5.0 (calc)   Non-HDL Cholesterol (Calc) 141 (H) <130 mg/dL (calc)      Assessment & Plan:   Problem List Items Addressed This Visit     Erectile dysfunction   Relevant Medications   tadalafil  (CIALIS ) 20 MG tablet   Mild emphysema (HCC) - Primary   Relevant Medications   albuterol  (PROVENTIL ) (2.5 MG/3ML) 0.083% nebulizer solution   Other Relevant Orders   COMPLETE METABOLIC PANEL WITH GFR   CBC with Differential/Platelet    Mixed hyperlipidemia   Relevant Medications   tadalafil  (CIALIS ) 20 MG tablet   Other Relevant Orders   CT CARDIAC SCORING (SELF PAY ONLY)   Lipid panel   COMPLETE METABOLIC PANEL WITH GFR   CBC with Differential/Platelet   Overweight (BMI 25.0-29.9)   Relevant Orders   CT CARDIAC SCORING (SELF PAY ONLY)   Other Visit Diagnoses       Generalized anxiety disorder with panic attacks       Relevant Medications   busPIRone  (BUSPAR ) 10 MG tablet     Elevated hemoglobin A1c       Relevant Orders   Hemoglobin A1c     Screening for prostate cancer       Relevant Orders   PSA        Updated Health Maintenance information Labs ordered today pending result Encouraged improvement to lifestyle with diet and exercise Goal of weight loss  Chronic Obstructive Pulmonary Disease (COPD)   Reports improvement in symptoms with use of home nebulizer with saline and humidifier. No current use of inhalers due to reported worsening of symptoms with his use.   -Order Albuterol  nebulizer solution for rescue use PRN   Cardiovascular Health   Concerns about circulation and family history of congestive heart failure. No current symptoms of cardiovascular disease.   -Order lipid panel to assess cholesterol levels.   -Order Coronary Calcium CT scan to assess for blockages in the arteries.   Reconsider Statin therapy pending results  Erectile Dysfunction   Reports maintenance issues despite arousal. Currently using Sildenafil  mixed results -Switch to Tadalafil  (generic Cialis ) 20mg  every 48 hours as needed.   Goodrx  Anxiety   Insomnia Reports mind racing at night, currently managed with Buspar  -Continue Buspar , refill prescription for 180 tablets.    General Health Maintenance   -Order routine blood work including lipid panel, chemistry, blood count, and prostate.   -Consider A1c testing due to past history of prediabetes.   -Consider Cologuard for colorectal cancer screening, discuss cost  and reliability concerns. Self pay $600 approx, -Advise patient to consider scheduling colonoscopy, discuss potential cost-saving options for uninsured patients.   -Advise patient to monitor blood pressure at home.   -Schedule follow-up appointment for one year from today.         Orders Placed This Encounter  Procedures   CT CARDIAC SCORING (SELF PAY ONLY)    Standing Status:   Future    Expiration Date:   06/06/2024    Preferred imaging location?:   [facility]   Lipid panel    Has the patient fasted?:  Yes   COMPLETE METABOLIC PANEL WITH GFR   CBC with Differential/Platelet   PSA   Hemoglobin A1c    Meds ordered this encounter  Medications   albuterol  (PROVENTIL ) (2.5 MG/3ML) 0.083% nebulizer solution    Sig: Take 3 mLs (2.5 mg total) by nebulization every 6 (six) hours as needed for wheezing or shortness of breath.    Dispense:  150 mL    Refill:  3   tadalafil  (CIALIS ) 20 MG tablet    Sig: Take 1 tablet (20 mg total) by mouth every other day as needed for erectile dysfunction.    Dispense:  90 tablet    Refill:  2    goodrx   busPIRone  (BUSPAR ) 10 MG tablet    Sig: Take 0.5-1 tablets (5-10 mg total) by mouth 2 (two) times daily as needed (anxiety panic).    Dispense:  180 tablet    Refill:  3     Follow up plan: Return for 1 year, physical > labs after.   Marsa Officer, DO [facility] 06/07/2023, 9:41 AM

## 2023-06-08 ENCOUNTER — Encounter: Payer: Self-pay | Admitting: Family Medicine

## 2023-06-08 LAB — CBC WITH DIFFERENTIAL/PLATELET
Absolute Lymphocytes: 2982 {cells}/uL (ref 850–3900)
Absolute Monocytes: 1037 {cells}/uL — ABNORMAL HIGH (ref 200–950)
Basophils Absolute: 85 {cells}/uL (ref 0–200)
Basophils Relative: 0.6 %
Eosinophils Absolute: 724 {cells}/uL — ABNORMAL HIGH (ref 15–500)
Eosinophils Relative: 5.1 %
HCT: 50.6 % — ABNORMAL HIGH (ref 38.5–50.0)
Hemoglobin: 17 g/dL (ref 13.2–17.1)
MCH: 29.3 pg (ref 27.0–33.0)
MCHC: 33.6 g/dL (ref 32.0–36.0)
MCV: 87.1 fL (ref 80.0–100.0)
MPV: 9.7 fL (ref 7.5–12.5)
Monocytes Relative: 7.3 %
Neutro Abs: 9372 {cells}/uL — ABNORMAL HIGH (ref 1500–7800)
Neutrophils Relative %: 66 %
Platelets: 352 10*3/uL (ref 140–400)
RBC: 5.81 10*6/uL — ABNORMAL HIGH (ref 4.20–5.80)
RDW: 13 % (ref 11.0–15.0)
Total Lymphocyte: 21 %
WBC: 14.2 10*3/uL — ABNORMAL HIGH (ref 3.8–10.8)

## 2023-06-08 LAB — LIPID PANEL
Cholesterol: 222 mg/dL — ABNORMAL HIGH (ref <200)
HDL: 37 mg/dL — ABNORMAL LOW (ref >=40)
LDL Cholesterol (Calc): 153 mg/dL — ABNORMAL HIGH
Non-HDL Cholesterol (Calc): 185 mg/dL — ABNORMAL HIGH (ref <130)
Total CHOL/HDL Ratio: 6 (calc) — ABNORMAL HIGH (ref <5.0)
Triglycerides: 186 mg/dL — ABNORMAL HIGH (ref <150)

## 2023-06-08 LAB — COMPLETE METABOLIC PANEL WITH GFR
AG Ratio: 1.6 (calc) (ref 1.0–2.5)
ALT: 24 U/L (ref 9–46)
AST: 17 U/L (ref 10–35)
Albumin: 4.4 g/dL (ref 3.6–5.1)
Alkaline phosphatase (APISO): 84 U/L (ref 35–144)
BUN: 13 mg/dL (ref 7–25)
CO2: 26 mmol/L (ref 20–32)
Calcium: 9.4 mg/dL (ref 8.6–10.3)
Chloride: 104 mmol/L (ref 98–110)
Creat: 1.3 mg/dL (ref 0.70–1.30)
Globulin: 2.8 g/dL (ref 1.9–3.7)
Glucose, Bld: 82 mg/dL (ref 65–99)
Potassium: 4.1 mmol/L (ref 3.5–5.3)
Sodium: 139 mmol/L (ref 135–146)
Total Bilirubin: 0.6 mg/dL (ref 0.2–1.2)
Total Protein: 7.2 g/dL (ref 6.1–8.1)
eGFR: 66 mL/min/{1.73_m2} (ref >=60)

## 2023-06-08 LAB — PSA: PSA: 0.47 ng/mL (ref <=4.00)

## 2023-06-08 LAB — HEMOGLOBIN A1C
Hgb A1c MFr Bld: 5.7 %{Hb} — ABNORMAL HIGH (ref <5.7)
Mean Plasma Glucose: 117 mg/dL
eAG (mmol/L): 6.5 mmol/L

## 2023-06-12 ENCOUNTER — Encounter: Payer: Self-pay | Admitting: Family Medicine

## 2023-06-12 DIAGNOSIS — J439 Emphysema, unspecified: Secondary | ICD-10-CM

## 2023-06-12 MED ORDER — DEXAMETHASONE 4 MG PO TABS: 4.0000 mg | ORAL_TABLET | Freq: Every day | ORAL | 0 refills | Status: AC

## 2023-06-13 ENCOUNTER — Ambulatory Visit
Admission: RE | Admit: 2023-06-13 | Discharge: 2023-06-13 | Disposition: A | Discharge status: Home / Self Care | Payer: Self-pay | Source: Ambulatory Visit | Attending: Family Medicine | Admitting: Family Medicine

## 2023-06-13 DIAGNOSIS — E663 Overweight: Secondary | ICD-10-CM | POA: Insufficient documentation

## 2023-06-13 DIAGNOSIS — E782 Mixed hyperlipidemia: Secondary | ICD-10-CM | POA: Insufficient documentation

## 2023-06-14 ENCOUNTER — Encounter: Payer: Self-pay | Admitting: Family Medicine

## 2024-05-29 ENCOUNTER — Ambulatory Visit: Payer: Self-pay | Admitting: Family Medicine
